# Patient Record
Sex: Female | Born: 1980
Health system: Southern US, Community
[De-identification: ages and names within clinical notes are randomized; demographics above are authoritative.]

## PROBLEM LIST (undated history)

## (undated) DIAGNOSIS — F419 Anxiety disorder, unspecified: Secondary | ICD-10-CM

## (undated) DIAGNOSIS — L719 Rosacea, unspecified: Secondary | ICD-10-CM

## (undated) DIAGNOSIS — T7840XA Allergy, unspecified, initial encounter: Secondary | ICD-10-CM

## (undated) DIAGNOSIS — R87619 Unspecified abnormal cytological findings in specimens from cervix uteri: Secondary | ICD-10-CM

## (undated) DIAGNOSIS — IMO0002 Reserved for concepts with insufficient information to code with codable children: Secondary | ICD-10-CM

## (undated) HISTORY — DX: Rosacea, unspecified: L71.9

## (undated) HISTORY — DX: Allergy, unspecified, initial encounter: T78.40XA

## (undated) HISTORY — PX: DILATION AND CURETTAGE OF UTERUS: SHX78

---

## 2006-06-21 ENCOUNTER — Emergency Department (HOSPITAL_COMMUNITY): Admission: EM | Admit: 2006-06-21 | Discharge: 2006-06-21 | Payer: Self-pay | Admitting: Emergency Medicine

## 2007-03-21 ENCOUNTER — Encounter: Admission: RE | Admit: 2007-03-21 | Discharge: 2007-03-21 | Payer: Self-pay | Admitting: Obstetrics and Gynecology
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
## 2007-12-29 ENCOUNTER — Encounter: Admission: RE | Admit: 2007-12-29 | Discharge: 2007-12-29 | Payer: Self-pay | Admitting: Orthopedic Surgery

## 2011-08-14 LAB — RUBELLA ANTIBODY, IGM: Rubella: IMMUNE

## 2011-08-14 LAB — ABO/RH

## 2011-08-14 LAB — RPR: RPR: NONREACTIVE

## 2011-08-14 LAB — HEPATITIS B SURFACE ANTIGEN: Hepatitis B Surface Ag: NEGATIVE

## 2011-12-04 NOTE — L&D Delivery Note (Signed)
Delivery Note At 4:29 AM a viable and healthy female was delivered via Vaginal, Spontaneous Delivery (Presentation: Left Occiput Anterior).  APGAR: 8, 9; weight 6 lb 13.9 oz (3115 g).   Placenta status: Intact, Spontaneous. Short marginal Cord: 3 vessels with the following complications: Short.  Cord pH: none  Anesthesia: Epidural  Episiotomy: None Lacerations: 2nd degree;Sulcus;Perineal;Periurethral;Vaginal Suture Repair: 3.0 chromic Est. Blood Loss (mL): 200  Mom to postpartum.  Baby to nursery-stable.  Covey Baller A 03/07/2012, 5:05 AM

## 2012-03-04 ENCOUNTER — Encounter (HOSPITAL_COMMUNITY): Payer: Self-pay | Admitting: *Deleted

## 2012-03-04 ENCOUNTER — Inpatient Hospital Stay (HOSPITAL_COMMUNITY)
Admission: AD | Admit: 2012-03-04 | Discharge: 2012-03-04 | Disposition: A | Discharge status: Home / Self Care | Payer: BC Managed Care – PPO | Source: Ambulatory Visit | Attending: Obstetrics and Gynecology | Admitting: Obstetrics and Gynecology

## 2012-03-04 DIAGNOSIS — O479 False labor, unspecified: Secondary | ICD-10-CM | POA: Insufficient documentation

## 2012-03-04 DIAGNOSIS — O4290 Premature rupture of membranes, unspecified as to length of time between rupture and onset of labor, unspecified weeks of gestation: Secondary | ICD-10-CM

## 2012-03-04 DIAGNOSIS — O368390 Maternal care for abnormalities of the fetal heart rate or rhythm, unspecified trimester, not applicable or unspecified: Secondary | ICD-10-CM | POA: Insufficient documentation

## 2012-03-04 DIAGNOSIS — O99891 Other specified diseases and conditions complicating pregnancy: Secondary | ICD-10-CM | POA: Insufficient documentation

## 2012-03-04 HISTORY — DX: Anxiety disorder, unspecified: F41.9

## 2012-03-04 HISTORY — DX: Unspecified abnormal cytological findings in specimens from cervix uteri: R87.619

## 2012-03-04 HISTORY — DX: Reserved for concepts with insufficient information to code with codable children: IMO0002

## 2012-03-04 NOTE — Discharge Instructions (Signed)
Oligohydramnios An unborn baby (fetus) lives in the mother's uterus in a sac of amniotic fluid. This fluid:  Protects the fetus from trauma.   Helps the fetus move freely inside the uterus.   Helps the fetal lungs, kidneys, and digestive system develop.   Protects the baby from infections.  Oligohydramnios is when there is not enough amniotic fluid in the amniotic sac. If this happens early in pregnancy, a fetus might not develop normally. If this happens in the second half of a pregnancy, a fetus might not grow as much as it should and could cause problems during delivery.  Oligohydramnios can also cause:  Pregnancy loss (miscarriage).   Premature birth.   Deformities of the face or body.   Problems with muscles and bones.   Pressure and compression on the umbilical cord, which decreases oxygen to the fetus.   Lung problems.   Stillbirth.  CAUSES A cause cannot always be found.However, possible causes include:  A leak or a tear in the amniotic sac.   A problem with the placenta.   Having identical twins who share the same placenta.   A fetal birth defect. This is usually something in the fetal kidneys or urinary tract that has not developed as it should.   A pregnancy that goes past the due date.   Something that affects the mother's body, such as:   Dehydration.   High blood pressure.   Diabetes.   Some medications (examples include ibuprofen and blood pressure medicines).   A disease that affects the skin, joints, kidneys and other organs (systemic lupus).   Birth defects.  SYMPTOMS  There may be no symptoms.   Leaking fluid from the vagina.   Measuring smaller than usual uterus at a routine pregnancy exam.  DIAGNOSIS To diagnose and evaluate oligohydramnios, your caregiver may:  Order a prenatal ultrasound test. This test:   Measures the amniotic fluid level. This will show if the amount of fluid is right for the stage of pregnancy.   Checks  the fetal kidneys.   Checks fetal growth.   Evaluates the placenta.   Confirm that you broke your water, if this is suspected by your caregiver.   Order a nonstress test. This noninvasive test is an assessment of fetal well-being.It monitors the fetal heart rate patterns over a 20 minute period.   Order a biophysical profile. This test measures and evaluates 5 observations of the baby (results of nonstress testing, fetal breathing, movements, muscle tone, and amount of amniotic fluid).   Assess fetal kick counts. Tell your caregiver if the baby appears to become less active.   Order a uterine artery doppler study. This is a type of ultrasound. It can show if enough blood and nourishment are getting to the fetus through the placenta and umbilical cord.   Check your blood pressure.   Check your blood sugar.  TREATMENT Treatment will depend on how low the fluid is, how far along in the pregnancy you are, and your overall health. Treatment options include:  Watching and waiting. You will need to be checked more often than normal.   Increasing your fluid intake. This may be done by mouth, or you might get the fluids through the vein (intravenously, IV).   Delivering the baby if recommended by your caregiver.  HOME CARE INSTRUCTIONS  Only take medicine as directed by your caregiver, especially if you have a medical problem (diabetes, high blood pressure).   Follow your caregiver's instructions regarding physical activity,  especially if you have a medical problem (diabetes, high blood pressure).   Keep all prenatal care appointments.   Rest as much as possible. Your caregiver may put you on bed rest.   Drink enough fluids to keep your urine clear or pale yellow.   Eat a healthy and nourishing diet.   Do not smoke, drink alcohol, or take illegal drugs.  SEEK MEDICAL CARE IF:  You have any questions or worries about your pregnancy.   You notice a decrease in fetal movement.    SEEK IMMEDIATE MEDICAL CARE IF:   Fluid comes out of your vagina.   You start to have labor pains (contractions).   You have a fever.  Document Released: 03/06/2011 Document Revised: 11/08/2011 Document Reviewed: 03/06/2011 [facility] Patient Information 2012 [place], [place].Fetal Movement Counts Patient Name: __________________________________________________ Patient Due Date: ____________________ Melody Haver counts is highly recommended in high risk pregnancies, but it is a good idea for every pregnant woman to do. Start counting fetal movements at 28 weeks of the pregnancy. Fetal movements increase after eating a full meal or eating or drinking something sweet (the blood sugar is higher). It is also important to drink plenty of fluids (well hydrated) before doing the count. Lie on your left side because it helps with the circulation or you can sit in a comfortable chair with your arms over your belly (abdomen) with no distractions around you. DOING THE COUNT  Try to do the count the same time of day each time you do it.   Mark the day and time, then see how long it takes for you to feel 10 movements (kicks, flutters, swishes, rolls). You should have at least 10 movements within 2 hours. You will most likely feel 10 movements in much less than 2 hours. If you do not, wait an hour and count again. After a couple of days you will see a pattern.   What you are looking for is a change in the pattern or not enough counts in 2 hours. Is it taking longer in time to reach 10 movements?  SEEK MEDICAL CARE IF:  You feel less than 10 counts in 2 hours. Tried twice.   No movement in one hour.   The pattern is changing or taking longer each day to reach 10 counts in 2 hours.   You feel the baby is not moving as it usually does.  Date: ____________ Movements: ____________ Start time: ____________ Doreatha Martin time: ____________  Date: ____________ Movements: ____________ Start time: ____________ Doreatha Martin time:  ____________ Date: ____________ Movements: ____________ Start time: ____________ Doreatha Martin time: ____________ Date: ____________ Movements: ____________ Start time: ____________ Doreatha Martin time: ____________ Date: ____________ Movements: ____________ Start time: ____________ Doreatha Martin time: ____________ Date: ____________ Movements: ____________ Start time: ____________ Doreatha Martin time: ____________ Date: ____________ Movements: ____________ Start time: ____________ Doreatha Martin time: ____________ Date: ____________ Movements: ____________ Start time: ____________ Doreatha Martin time: ____________  Date: ____________ Movements: ____________ Start time: ____________ Doreatha Martin time: ____________ Date: ____________ Movements: ____________ Start time: ____________ Doreatha Martin time: ____________ Date: ____________ Movements: ____________ Start time: ____________ Doreatha Martin time: ____________ Date: ____________ Movements: ____________ Start time: ____________ Doreatha Martin time: ____________ Date: ____________ Movements: ____________ Start time: ____________ Doreatha Martin time: ____________ Date: ____________ Movements: ____________ Start time: ____________ Doreatha Martin time: ____________ Date: ____________ Movements: ____________ Start time: ____________ Doreatha Martin time: ____________  Date: ____________ Movements: ____________ Start time: ____________ Doreatha Martin time: ____________ Date: ____________ Movements: ____________ Start time: ____________ Doreatha Martin time: ____________ Date: ____________ Movements: ____________ Start time: ____________ Doreatha Martin time: ____________ Date: ____________ Movements: ____________  Start time: ____________ Doreatha Martin time: ____________ Date: ____________ Movements: ____________ Start time: ____________ Doreatha Martin time: ____________ Date: ____________ Movements: ____________ Start time: ____________ Doreatha Martin time: ____________ Date: ____________ Movements: ____________ Start time: ____________ Doreatha Martin time: ____________  Date: ____________ Movements:  ____________ Start time: ____________ Doreatha Martin time: ____________ Date: ____________ Movements: ____________ Start time: ____________ Doreatha Martin time: ____________ Date: ____________ Movements: ____________ Start time: ____________ Doreatha Martin time: ____________ Date: ____________ Movements: ____________ Start time: ____________ Doreatha Martin time: ____________ Date: ____________ Movements: ____________ Start time: ____________ Doreatha Martin time: ____________ Date: ____________ Movements: ____________ Start time: ____________ Doreatha Martin time: ____________ Date: ____________ Movements: ____________ Start time: ____________ Doreatha Martin time: ____________  Date: ____________ Movements: ____________ Start time: ____________ Doreatha Martin time: ____________ Date: ____________ Movements: ____________ Start time: ____________ Doreatha Martin time: ____________ Date: ____________ Movements: ____________ Start time: ____________ Doreatha Martin time: ____________ Date: ____________ Movements: ____________ Start time: ____________ Doreatha Martin time: ____________ Date: ____________ Movements: ____________ Start time: ____________ Doreatha Martin time: ____________ Date: ____________ Movements: ____________ Start time: ____________ Doreatha Martin time: ____________ Date: ____________ Movements: ____________ Start time: ____________ Doreatha Martin time: ____________  Date: ____________ Movements: ____________ Start time: ____________ Doreatha Martin time: ____________ Date: ____________ Movements: ____________ Start time: ____________ Doreatha Martin time: ____________ Date: ____________ Movements: ____________ Start time: ____________ Doreatha Martin time: ____________ Date: ____________ Movements: ____________ Start time: ____________ Doreatha Martin time: ____________ Date: ____________ Movements: ____________ Start time: ____________ Doreatha Martin time: ____________ Date: ____________ Movements: ____________ Start time: ____________ Doreatha Martin time: ____________ Date: ____________ Movements: ____________ Start time: ____________ Doreatha Martin  time: ____________  Date: ____________ Movements: ____________ Start time: ____________ Doreatha Martin time: ____________ Date: ____________ Movements: ____________ Start time: ____________ Doreatha Martin time: ____________ Date: ____________ Movements: ____________ Start time: ____________ Doreatha Martin time: ____________ Date: ____________ Movements: ____________ Start time: ____________ Doreatha Martin time: ____________ Date: ____________ Movements: ____________ Start time: ____________ Doreatha Martin time: ____________ Date: ____________ Movements: ____________ Start time: ____________ Doreatha Martin time: ____________ Date: ____________ Movements: ____________ Start time: ____________ Doreatha Martin time: ____________  Date: ____________ Movements: ____________ Start time: ____________ Doreatha Martin time: ____________ Date: ____________ Movements: ____________ Start time: ____________ Doreatha Martin time: ____________ Date: ____________ Movements: ____________ Start time: ____________ Doreatha Martin time: ____________ Date: ____________ Movements: ____________ Start time: ____________ Doreatha Martin time: ____________ Date: ____________ Movements: ____________ Start time: ____________ Doreatha Martin time: ____________ Date: ____________ Movements: ____________ Start time: ____________ Doreatha Martin time: ____________ Document Released: 12/19/2006 Document Revised: 11/08/2011 Document Reviewed: 06/21/2009 [employer] Patient Information 2012 [employer].Braxton Hicks Contractions Pregnancy is commonly associated with contractions of the uterus throughout the pregnancy. Towards the end of pregnancy (32 to 34 weeks), these contractions [facility] Willa Rough) can develop more often and may become more forceful. This is not true labor because these contractions do not result in opening (dilatation) and thinning of the cervix. They are sometimes difficult to tell apart from true labor because these contractions can be forceful and people have different pain tolerances. You should not feel embarrassed if  you go to the hospital with false labor. Sometimes, the only way to tell if you are in true labor is for your caregiver to follow the changes in the cervix. How to tell the difference between true and false labor:  False labor.   The contractions of false labor are usually shorter, irregular and not as hard as those of true labor.   They are often felt in the front of the lower abdomen and in the groin.   They may leave with walking around or changing positions while lying down.   They get weaker and are shorter lasting as time goes on.   These contractions are usually irregular.   They  do not usually become progressively stronger, regular and closer together as with true labor.   True labor.   Contractions in true labor last 30 to 70 seconds, become very regular, usually become more intense, and increase in frequency.   They do not go away with walking.   The discomfort is usually felt in the top of the uterus and spreads to the lower abdomen and low back.   True labor can be determined by your caregiver with an exam. This will show that the cervix is dilating and getting thinner.  If there are no prenatal problems or other health problems associated with the pregnancy, it is completely safe to be sent home with false labor and await the onset of true labor. HOME CARE INSTRUCTIONS   Keep up with your usual exercises and instructions.   Take medications as directed.   Keep your regular prenatal appointment.   Eat and drink lightly if you think you are going into labor.   If BH contractions are making you uncomfortable:   Change your activity position from lying down or resting to walking/walking to resting.   Sit and rest in a tub of warm water.   Drink 2 to 3 glasses of water. Dehydration may cause B-H contractions.   Do slow and deep breathing several times an hour.  SEEK IMMEDIATE MEDICAL CARE IF:   Your contractions continue to become stronger, more regular, and  closer together.   You have a gushing, burst or leaking of fluid from the vagina.   An oral temperature above 102 F (38.9 C) develops.   You have passage of blood-tinged mucus.   You develop vaginal bleeding.   You develop continuous belly (abdominal) pain.   You have low back pain that you never had before.   You feel the baby's Mclennan pushing down causing pelvic pressure.   The baby is not moving as much as it used to.  Document Released: 11/19/2005 Document Revised: 11/08/2011 Document Reviewed: 05/13/2009 [facility] Patient Information 2012 [place], [place].

## 2012-03-04 NOTE — MAU Note (Signed)
Pt states she lost her mucous plug today around 2:30 or 3:00 pm and had a lot of wetness since for approx. 1 hour.  Wetness has decreased in last 1-2 hours.  No vaginal bleeding except a small amount in the mucous plug.

## 2012-03-04 NOTE — MAU Provider Note (Signed)
History    31 yo G2P0010 MWF now @ 37 2/7 wk presents for r/o SROm due to c/o wetness after loss mucus plug. (+) irreg ctx (+) FM( very active). sopno 2 wk ago upper nl AFI  Chief Complaint  Patient presents with  . Rupture of Membranes     OB History    Grav Para Term Preterm Abortions TAB SAB Ect Mult Living   2    1  1    0      Past Medical History  Diagnosis Date  . Abnormal Pap smear   . Anxiety     Past Surgical History  Procedure Date  . No past surgeries     Family History  Problem Relation Age of Onset  . Anesthesia problems Neg Hx     History  Substance Use Topics  . Smoking status: Never Smoker   . Smokeless tobacco: Never Used  . Alcohol Use: No    Allergies: No Known Allergies  Prescriptions prior to admission  Medication Sig Dispense Refill  . bifidobacterium infantis (ALIGN) capsule Take 1 capsule by mouth daily.      . hydrocortisone cream 1 % Apply 1 application topically daily as needed. For itching      . Prenatal Vit-Fe Fumarate-FA (PRENATAL MULTIVITAMIN) TABS Take 1 tablet by mouth daily.         Physical Exam   Blood pressure 125/79, pulse 78, temperature 99.4 F (37.4 C), temperature source Oral, resp. rate 20, height 5\' 4"  (1.626 m), weight 155 lb (70.308 kg), last menstrual period 06/17/2011, SpO2 98.00%.  General appearance: alert, cooperative and no distress Abdomen: gravid soft nontender Pelvic: cervix normal in appearance, external genitalia normal and closed/60%/-3 vtx white d/c Extremities: edema trace ED Course  Fern neg Tracing: baseline 130 (+) accels some variables ctx q 2-52mins MDM No evidence of ROM or labor IUP @ 37 2/7 wk P) d/c home labor prec. OB appt 1 wk   [name] A, MD 6:37 PM 03/04/2012

## 2012-03-06 ENCOUNTER — Encounter (HOSPITAL_COMMUNITY): Payer: Self-pay | Admitting: Anesthesiology

## 2012-03-06 ENCOUNTER — Inpatient Hospital Stay (HOSPITAL_COMMUNITY)
Admission: AD | Admit: 2012-03-06 | Discharge: 2012-03-08 | DRG: 373 | Disposition: A | Discharge status: Home / Self Care | Payer: BC Managed Care – PPO | Source: Ambulatory Visit | Attending: Obstetrics and Gynecology | Admitting: Obstetrics and Gynecology

## 2012-03-06 ENCOUNTER — Inpatient Hospital Stay (HOSPITAL_COMMUNITY): Payer: BC Managed Care – PPO | Admitting: Anesthesiology

## 2012-03-06 LAB — CBC
MCHC: 34 g/dL (ref 30.0–36.0)
Platelets: 246 10*3/uL (ref 150–400)
RDW: 14.1 % (ref 11.5–15.5)
WBC: 12.9 10*3/uL — ABNORMAL HIGH (ref 4.0–10.5)

## 2012-03-06 LAB — RPR: RPR Ser Ql: NONREACTIVE

## 2012-03-06 MED ORDER — LACTATED RINGERS IV SOLN
INTRAVENOUS | Status: DC
  Administered 2012-03-06: 21:00:00 via INTRAUTERINE

## 2012-03-06 MED ORDER — OXYCODONE-ACETAMINOPHEN 5-325 MG PO TABS: 1.0000 | ORAL_TABLET | ORAL | Status: DC | PRN

## 2012-03-06 MED ORDER — LACTATED RINGERS IV SOLN
INTRAVENOUS | Status: DC
  Administered 2012-03-06 (×2): via INTRAVENOUS

## 2012-03-06 MED ORDER — LACTATED RINGERS IV SOLN
500.0000 mL | Freq: Once | INTRAVENOUS | Status: AC
  Administered 2012-03-06: 20:00:00 via INTRAVENOUS

## 2012-03-06 MED ORDER — TERBUTALINE SULFATE 1 MG/ML IJ SOLN
0.2500 mg | Freq: Once | INTRAMUSCULAR | Status: AC | PRN
  Administered 2012-03-06: 0.25 mg via SUBCUTANEOUS
  Filled 2012-03-06: qty 1

## 2012-03-06 MED ORDER — NALBUPHINE SYRINGE 5 MG/0.5 ML: 10.0000 mg | INJECTION | INTRAMUSCULAR | Status: DC | PRN

## 2012-03-06 MED ORDER — FENTANYL 2.5 MCG/ML BUPIVACAINE 1/10 % EPIDURAL INFUSION (WH - ANES)
14.0000 mL/h | INTRAMUSCULAR | Status: DC
  Administered 2012-03-06: 14 mL/h via EPIDURAL
  Filled 2012-03-06 (×3): qty 60

## 2012-03-06 MED ORDER — IBUPROFEN 600 MG PO TABS: 600.0000 mg | ORAL_TABLET | Freq: Four times a day (QID) | ORAL | Status: DC | PRN

## 2012-03-06 MED ORDER — OXYTOCIN 20 UNITS IN LACTATED RINGERS INFUSION - SIMPLE
1.0000 m[IU]/min | INTRAVENOUS | Status: DC
  Administered 2012-03-06: 2 m[IU]/min via INTRAVENOUS
  Filled 2012-03-06: qty 1000

## 2012-03-06 MED ORDER — PHENYLEPHRINE 40 MCG/ML (10ML) SYRINGE FOR IV PUSH (FOR BLOOD PRESSURE SUPPORT)
80.0000 ug | PREFILLED_SYRINGE | INTRAVENOUS | Status: DC | PRN
  Filled 2012-03-06: qty 5

## 2012-03-06 MED ORDER — LIDOCAINE HCL (PF) 1 % IJ SOLN
INTRAMUSCULAR | Status: DC | PRN
  Administered 2012-03-06 (×2): 8 mL
  Administered 2012-03-07: 30 mL

## 2012-03-06 MED ORDER — PHENYLEPHRINE 40 MCG/ML (10ML) SYRINGE FOR IV PUSH (FOR BLOOD PRESSURE SUPPORT): 80.0000 ug | PREFILLED_SYRINGE | INTRAVENOUS | Status: DC | PRN

## 2012-03-06 MED ORDER — NALBUPHINE SYRINGE 5 MG/0.5 ML
10.0000 mg | INJECTION | INTRAMUSCULAR | Status: DC | PRN
  Filled 2012-03-06: qty 1

## 2012-03-06 MED ORDER — CITRIC ACID-SODIUM CITRATE 334-500 MG/5ML PO SOLN: 30.0000 mL | ORAL | Status: DC | PRN

## 2012-03-06 MED ORDER — EPHEDRINE 5 MG/ML INJ: 10.0000 mg | INTRAVENOUS | Status: DC | PRN

## 2012-03-06 MED ORDER — EPHEDRINE 5 MG/ML INJ
10.0000 mg | INTRAVENOUS | Status: DC | PRN
  Filled 2012-03-06: qty 4

## 2012-03-06 MED ORDER — OXYTOCIN BOLUS FROM INFUSION
500.0000 mL | Freq: Once | INTRAVENOUS | Status: DC
  Filled 2012-03-06: qty 500

## 2012-03-06 MED ORDER — OXYTOCIN 20 UNITS IN LACTATED RINGERS INFUSION - SIMPLE: 125.0000 mL/h | Freq: Once | INTRAVENOUS | Status: DC

## 2012-03-06 MED ORDER — LACTATED RINGERS IV SOLN: 500.0000 mL | INTRAVENOUS | Status: DC | PRN

## 2012-03-06 MED ORDER — ONDANSETRON HCL 4 MG/2ML IJ SOLN: 4.0000 mg | Freq: Four times a day (QID) | INTRAMUSCULAR | Status: DC | PRN

## 2012-03-06 MED ORDER — LIDOCAINE HCL (PF) 1 % IJ SOLN
30.0000 mL | INTRAMUSCULAR | Status: DC | PRN
  Filled 2012-03-06: qty 30

## 2012-03-06 MED ORDER — ACETAMINOPHEN 325 MG PO TABS: 650.0000 mg | ORAL_TABLET | ORAL | Status: DC | PRN

## 2012-03-06 MED ORDER — HYDROXYZINE HCL 50 MG/ML IM SOLN: 50.0000 mg | INTRAMUSCULAR | Status: DC | PRN

## 2012-03-06 MED ORDER — DIPHENHYDRAMINE HCL 50 MG/ML IJ SOLN: 12.5000 mg | INTRAMUSCULAR | Status: DC | PRN

## 2012-03-06 MED ORDER — FLEET ENEMA 7-19 GM/118ML RE ENEM: 1.0000 | ENEMA | RECTAL | Status: DC | PRN

## 2012-03-06 MED ORDER — FENTANYL 2.5 MCG/ML BUPIVACAINE 1/10 % EPIDURAL INFUSION (WH - ANES)
INTRAMUSCULAR | Status: DC | PRN
  Administered 2012-03-06: 14 mL/h via EPIDURAL

## 2012-03-06 NOTE — Progress Notes (Signed)
S; epidural.  Notes window of pain on left  O: VE deferred  Pitocin 4 miu( resumed at 10 PM  Tracing reviewed:  Baseline 130  mod variability no decel ctx q 2-3 mins IMP:  Reassuring tracing P) cont pitocin. Exaggerated sims position

## 2012-03-06 NOTE — Progress Notes (Signed)
S: Pt received epidural anesthesia. tracing reactive pre- procedure. Pt was on 10 MIU  Pitocin. Pt subsequently had ctx q 2 mins with FHR decel down to 80's x 6 mins. Pitocin d/c'ed  VE done: 4/80/-2  (+) scalp stimulation  [place]  Terbutaline given   ISE/IUPC placed  FHR returns to baseline 150 w/ decreased variability Rare ctx   IMP; Fetal heart decel 2nd to ? tetanic ctx vs epidural now return to baseline  P) amnioinfusion. Resume pitocin if indicated close fetal monitoring

## 2012-03-06 NOTE — Anesthesia Preprocedure Evaluation (Signed)
Anesthesia Evaluation  Patient identified by MRN, date of birth, ID band Patient awake    Reviewed: Allergy & Precautions, H&P , NPO status , Patient's Chart, lab work & pertinent test results  Airway Mallampati: I TM Distance: >3 FB Neck ROM: full    Dental No notable dental hx.    Pulmonary neg pulmonary ROS,  breath sounds clear to auscultation  Pulmonary exam normal       Cardiovascular negative cardio ROS      Neuro/Psych PSYCHIATRIC DISORDERS Anxiety negative neurological ROS  negative psych ROS   GI/Hepatic negative GI ROS, Neg liver ROS,   Endo/Other  negative endocrine ROS  Renal/GU negative Renal ROS  negative genitourinary   Musculoskeletal negative musculoskeletal ROS (+)   Abdominal Normal abdominal exam  (+)   Peds negative pediatric ROS (+)  Hematology negative hematology ROS (+)   Anesthesia Other Findings   Reproductive/Obstetrics (+) Pregnancy                           Anesthesia Physical Anesthesia Plan  ASA: II  Anesthesia Plan: Epidural   Post-op Pain Management:    Induction:   Airway Management Planned:   Additional Equipment:   Intra-op Plan:   Post-operative Plan:   Informed Consent: I have reviewed the patients History and Physical, chart, labs and discussed the procedure including the risks, benefits and alternatives for the proposed anesthesia with the patient or authorized representative who has indicated his/her understanding and acceptance.     Plan Discussed with:   Anesthesia Plan Comments:         Anesthesia Quick Evaluation

## 2012-03-06 NOTE — Anesthesia Procedure Notes (Signed)
Epidural Patient location during procedure: OB Start time: 03/06/2012 8:17 PM End time: 03/06/2012 8:21 PM Reason for block: procedure for pain  Staffing Anesthesiologist: Sandrea Hughs Performed by: anesthesiologist   Preanesthetic Checklist Completed: patient identified, site marked, surgical consent, pre-op evaluation, timeout performed, IV checked, risks and benefits discussed and monitors and equipment checked  Epidural Patient position: sitting Prep: site prepped and draped and DuraPrep Patient monitoring: continuous pulse ox and blood pressure Approach: midline Injection technique: LOR air  Needle:  Needle type: Tuohy  Needle gauge: 17 G Needle length: 9 cm Needle insertion depth: 5 cm cm Catheter type: closed end flexible Catheter size: 19 Gauge Catheter at skin depth: 10 cm Test dose: negative and Other  Assessment Sensory level: T10 Events: blood not aspirated, injection not painful, no injection resistance, negative IV test and no paresthesia

## 2012-03-06 NOTE — H&P (Signed)
Jaime Hampton is a 31 y.o. female presenting for SROM clear fluid @ 10: 30 am. (+) FM (+) irreg ctx Maternal Medical History:  Reason for admission: Reason for admission: rupture of membranes.  Contractions: Frequency: irregular.    Fetal activity: Perceived fetal activity is normal.    Prenatal Complications - Diabetes: none.    OB History    Grav Para Term Preterm Abortions TAB SAB Ect Mult Living   2    1  1    0     Past Medical History  Diagnosis Date  . Abnormal Pap smear   . Anxiety    Past Surgical History  Procedure Date  . No past surgeries    Family History: family history is negative for Anesthesia problems. Social History:  reports that she has never smoked. She has never used smokeless tobacco. She reports that she does not drink alcohol or use illicit drugs.  ROS neg  Dilation: 3.5 Effacement (%): 90 Station: -1 Exam by:: Penley, RN  Blood pressure 119/63, pulse 67, temperature 98.3 F (36.8 C), temperature source Oral, resp. rate 20, height 5\' 4"  (1.626 m), weight 151 lb (68.493 kg), last menstrual period 06/17/2011. Exam Physical Exam  Constitutional: She is oriented to person, place, and time. She appears well-developed and well-nourished.  Eyes: EOM are normal.  Neck: Neck supple.  Cardiovascular: Regular rhythm.   Respiratory: Breath sounds normal.  GI: Soft.  Musculoskeletal: She exhibits no edema.  Neurological: She is alert and oriented to person, place, and time.  Skin: Skin is warm and dry.  Psychiatric: She has a normal mood and affect.   tracing reactive irreg ctx Prenatal labs: ABO, Rh: A/Positive/-- (09/11 0000) Antibody: Negative (09/11 0000) Rubella: Immune (09/11 0000) RPR: Nonreactive (09/11 0000)  HBsAg: Negative (09/11 0000)  HIV: Non-reactive (09/11 0000)  GBS: Negative (03/14 0000)   Assessment/Plan SROM IUP @ 37 4/7  P) admit. Routine labs. Pain meds. pitocin   Natalie Leclaire A 03/06/2012, 7:58 PM

## 2012-03-07 ENCOUNTER — Encounter (HOSPITAL_COMMUNITY): Payer: Self-pay | Admitting: Obstetrics

## 2012-03-07 MED ORDER — PRENATAL MULTIVITAMIN CH
1.0000 | ORAL_TABLET | Freq: Every day | ORAL | Status: DC
  Administered 2012-03-07 – 2012-03-08 (×2): 1 via ORAL
  Filled 2012-03-07 (×2): qty 1

## 2012-03-07 MED ORDER — OXYCODONE-ACETAMINOPHEN 5-325 MG PO TABS
1.0000 | ORAL_TABLET | ORAL | Status: DC | PRN
  Administered 2012-03-07: 1 via ORAL
  Administered 2012-03-07 (×2): 2 via ORAL
  Filled 2012-03-07: qty 1
  Filled 2012-03-07 (×2): qty 2

## 2012-03-07 MED ORDER — BENZOCAINE-MENTHOL 20-0.5 % EX AERO: 1.0000 "application " | INHALATION_SPRAY | CUTANEOUS | Status: DC | PRN

## 2012-03-07 MED ORDER — MEASLES, MUMPS & RUBELLA VAC ~~LOC~~ INJ
0.5000 mL | INJECTION | Freq: Once | SUBCUTANEOUS | Status: DC
  Filled 2012-03-07: qty 0.5

## 2012-03-07 MED ORDER — DIPHENHYDRAMINE HCL 25 MG PO CAPS: 25.0000 mg | ORAL_CAPSULE | Freq: Four times a day (QID) | ORAL | Status: DC | PRN

## 2012-03-07 MED ORDER — IBUPROFEN 600 MG PO TABS
600.0000 mg | ORAL_TABLET | Freq: Four times a day (QID) | ORAL | Status: DC
  Administered 2012-03-07 – 2012-03-08 (×6): 600 mg via ORAL
  Filled 2012-03-07 (×5): qty 1

## 2012-03-07 MED ORDER — ONDANSETRON HCL 4 MG PO TABS: 4.0000 mg | ORAL_TABLET | ORAL | Status: DC | PRN

## 2012-03-07 MED ORDER — ONDANSETRON HCL 4 MG/2ML IJ SOLN: 4.0000 mg | INTRAMUSCULAR | Status: DC | PRN

## 2012-03-07 MED ORDER — DIBUCAINE 1 % RE OINT: 1.0000 "application " | TOPICAL_OINTMENT | RECTAL | Status: DC | PRN

## 2012-03-07 MED ORDER — BENZOCAINE-MENTHOL 20-0.5 % EX AERO
INHALATION_SPRAY | CUTANEOUS | Status: AC
  Filled 2012-03-07: qty 56

## 2012-03-07 MED ORDER — SENNOSIDES-DOCUSATE SODIUM 8.6-50 MG PO TABS
2.0000 | ORAL_TABLET | Freq: Every day | ORAL | Status: DC
  Administered 2012-03-07: 2 via ORAL

## 2012-03-07 MED ORDER — ZOLPIDEM TARTRATE 5 MG PO TABS: 5.0000 mg | ORAL_TABLET | Freq: Every evening | ORAL | Status: DC | PRN

## 2012-03-07 MED ORDER — TETANUS-DIPHTH-ACELL PERTUSSIS 5-2.5-18.5 LF-MCG/0.5 IM SUSP
0.5000 mL | Freq: Once | INTRAMUSCULAR | Status: AC
  Administered 2012-03-08: 0.5 mL via INTRAMUSCULAR
  Filled 2012-03-07: qty 0.5

## 2012-03-07 MED ORDER — LANOLIN HYDROUS EX OINT: TOPICAL_OINTMENT | CUTANEOUS | Status: DC | PRN

## 2012-03-07 MED ORDER — WITCH HAZEL-GLYCERIN EX PADS: 1.0000 "application " | MEDICATED_PAD | CUTANEOUS | Status: DC | PRN

## 2012-03-07 MED ORDER — SIMETHICONE 80 MG PO CHEW: 80.0000 mg | CHEWABLE_TABLET | ORAL | Status: DC | PRN

## 2012-03-07 NOTE — Progress Notes (Signed)
UR Chart review completed.  

## 2012-03-07 NOTE — Anesthesia Postprocedure Evaluation (Addendum)
  Anesthesia Post-op Note  Patient: Jaime Hampton  Procedure(s) Performed: * No procedures listed *  Patient Location: 311  Anesthesia Type: Epidural  Level of Consciousness: awake, alert  and oriented  Airway and Oxygen Therapy: Patient Spontanous Breathing  Post-op Pain: mild  Post-op Assessment: Post epidural cardiovascular vital signs stable. Still experiencing some residual numbness and weakness in legs R > L.  Patient reports that right leg was much more numb than left during labor. The symptoms are gradually improving as time progresses.  Post-op Vital Signs: Reviewed and stable  Complications: No apparent anesthesia complications

## 2012-03-07 NOTE — Progress Notes (Signed)
S: comfortable. No urge to push  VE: fully (+) 2 station Amnioinfusion  Tracing > baseline 140 reactive. Some variable decels ctx q 3 mins  IMP: complete P) start pushing

## 2012-03-08 ENCOUNTER — Encounter (HOSPITAL_COMMUNITY): Payer: Self-pay | Admitting: *Deleted

## 2012-03-08 LAB — CBC
HCT: 25.8 % — ABNORMAL LOW (ref 36.0–46.0)
Hemoglobin: 8.7 g/dL — ABNORMAL LOW (ref 12.0–15.0)
MCV: 96.3 fL (ref 78.0–100.0)
RBC: 2.68 MIL/uL — ABNORMAL LOW (ref 3.87–5.11)
WBC: 14 10*3/uL — ABNORMAL HIGH (ref 4.0–10.5)

## 2012-03-08 MED ORDER — INTEGRA PLUS PO CAPS: 1.0000 | ORAL_CAPSULE | Freq: Every day | ORAL | Status: DC

## 2012-03-08 MED ORDER — IBUPROFEN 800 MG PO TABS: ORAL_TABLET | ORAL | Status: DC

## 2012-03-08 MED ORDER — OXYCODONE-ACETAMINOPHEN 5-325 MG PO TABS: 1.0000 | ORAL_TABLET | ORAL | Status: AC | PRN

## 2012-03-08 NOTE — Progress Notes (Signed)
D/C instructions reviewed with pt.  Pt. States understanding of self care and baby care.  No home equipment needed.  Ambulated to car with staff without incident with staff.  D/C'd home with family.

## 2012-03-08 NOTE — Discharge Summary (Signed)
Obstetric Discharge Summary Reason for Admission: rupture of membranes Prenatal Procedures: ultrasound Intrapartum Procedures: spontaneous vaginal delivery Postpartum Procedures: none Complications-Operative and Postpartum: 2nd degree perineal laceration and vaginal laceration Hemoglobin  Date Value Range Status  03/08/2012 8.7* 12.0-15.0 (g/dL) Final     DELTA CHECK NOTED     REPEATED TO VERIFY     HCT  Date Value Range Status  03/08/2012 25.8* 36.0-46.0 (%) Final    Physical Exam:  General: alert, cooperative and no distress Lochia: appropriate Uterine Fundus: firm 1 FB  Above umb Incision: none DVT Evaluation: No evidence of DVT seen on physical exam.  Discharge Diagnoses: Term Pregnancy-delivered  Discharge Information: Date: 03/08/2012 Activity: pelvic rest Diet: routine Medications: PNV, Ibuprofen, Iron and Percocet Condition: stable Instructions: refer to practice specific booklet Discharge to: home Follow-up Information    Follow up with Addilynn Mowrer A, MD in 6 weeks.   Contact information:   [address] [place] [address] [phone]          Newborn Data: Live born female  Birth Weight: 6 lb 13.9 oz (3115 g) APGAR: 8, 9  Home with mother.  Corayma Cashatt A 03/08/2012, 10:34 AM

## 2012-03-08 NOTE — Progress Notes (Signed)
PPD1 SVD:   S:  Pt reports feeling well. Bottom sore / Tolerating po/ Voiding without problems/ No n/v/ Bleeding is moderate/ Pain controlled withibuprofen (OTC) and narcotic analgesics including percocet  Newborn info  Live female circ done this am. BRF   O:  A & O x 3 stable/ VS: Blood pressure 103/59, pulse 79, temperature 98.2 F (36.8 C), temperature source Oral, resp. rate 18, height 5\' 4"  (1.626 m), weight 151 lb (68.493 kg), last menstrual period 06/17/2011, SpO2 98.00%, unknown if currently breastfeeding.  LABS:  Results for orders placed during the hospital encounter of 03/06/12 (from the past 24 hour(s))  CBC     Status: Abnormal   Collection Time   03/08/12  5:37 AM      Component Value Range   WBC 14.0 (*) 4.0 - 10.5 (K/uL)   RBC 2.68 (*) 3.87 - 5.11 (MIL/uL)   Hemoglobin 8.7 (*) 12.0 - 15.0 (g/dL)   HCT 45.4 (*) 09.8 - 46.0 (%)   MCV 96.3  78.0 - 100.0 (fL)   MCH 32.8  26.0 - 34.0 (pg)   MCHC 34.1  30.0 - 36.0 (g/dL)   RDW 11.9  14.7 - 82.9 (%)   Platelets 214  150 - 400 (K/uL)    I&O: I/O last 3 completed shifts: In: -  Out: 200 [Blood:200]      Lungs: chest clear, no wheezing, rales, normal symmetric air entry, Heart exam - S1, S2 normal, no murmur, no gallop, rate regular  Heart: regular rate and rhythm  Abdomen: benign non-tender, without masses or organomegaly palpable ut firm 1 fb above umb firm  Perineum: healing with good reapproximation  Lochia: mod  Extremities:no redness or tenderness in the calves or thighs, no edema, edema tr+    A/P: PPD # 1/ [id number] 2) Anemia  Doing well  Continue routine post partum orders  Motrin 800 mg po q6-8 hr prn              Tylox 1-2 tabs po q 3-4 hrs prn integra plus one po qd  pp booklet given

## 2012-03-08 NOTE — Discharge Instructions (Addendum)
Call if temperature greater than equal to 100.4, nothing per vagina for 4-6 weeks or severe nausea vomiting, increased incisional pain , drainage or redness in the incision site, no straining with bowel movements, showers no bath Home Care Instructions for Mom After discharge, you may discover that you still have questions about body changes, activity, and care during the next few weeks. The following information should be helpful in answering many of your questions. ACTIVITY  Gradually resume your daily activities at home.   Allow time for rest periods during the day and nap while your newborn sleeps.   Avoid heavy lifting (more than 10 lb [4.5 kg]) and strenuous work or sports. Slow to moderate walking is usually safe.   If you had a cesarean delivery, do not vacuum, climb stairs, or drive a car for 4 to 6 weeks.   If you had a cesarean delivery, make arrangements for help at home until you feel you are okay to do your usual activities yourself.   Ask your caregiver for safe exercises to do after delivery, especially if you had a cesarean delivery.  VAGINAL FLOW AND RETURN OF YOUR MENSTRUAL PERIOD  Vaginal flow may continue for 4 to 6 weeks after delivery.   Usually the amount decreases and the color of blood gets lighter.   Bright red blood and an increased flow may reoccur if you have been too active.   Lie down, raise (elevate) your feet, place a cold compress on your lower abdomen, rest, and call your caregiver if you are soaking more than 1 pad an hour or passing large clots.   Menstrual periods will usually return 6 to 8 weeks after delivery.   If you are breastfeeding, your period will return anywhere from 8 weeks to the time you stop breastfeeding.  PERINEAL CARE  Use the peri bottle and change pads each time you go to the bathroom.   Use towelettes in place of toilet paper until stitches are healed.   Take warm tub baths for 15 to 20 minutes.   Continue to use medicated  pads or pain relieving spray.   Lidocaine cream for episiotomy pain can be used with your caregiver's approval.   Do not use tampons or douches until vaginal bleeding has stopped (about 4 weeks).   Sexual intercourse should be avoided for at least 3 to 4 weeks after delivery or until the brownish-red vaginal flow is completely gone.   Wipe from front to back.  INCISION CARE (AFTER A CESAREAN DELIVERY)  Shower as desired. Try to keep your incision dry.  BOWELS AND HEMORRHOIDS  Drink at least 6 to 8 glasses of non-caffeinated fluids per day.   Eat fiber-rich diet with whole grains, raw fruits, and vegetables.   Take frequent warm tub baths if hemorrhoids are a problem.   Avoid straining when having a bowel movement.   Over-the-counter medicines and stool softeners can be used as directed by your caregiver.  NUTRITION  Eat a well-balanced diet that includes the basic food groups.   Do not try to lose weight quickly by cutting back on calories.   If you are breastfeeding, drink at least 8 to 10 glasses of non-caffeinated fluid per day and increase your intake by 600 calories a day.   Take your prenatal vitamins until your postpartum checkup or until your caregiver tells you to stop.  BREASTFEEDING If you are not breastfeeding:  Wear a good tight-fitting bra.   Limit fluid intake after 1 or  2 days after delivery, or as directed, if your breasts are becoming engorged.   Avoid nipple stimulation and apply cool (not icy cold) compresses to the breasts for comfort as needed.   Avoid drinking alcohol and caffeinated drinks.   Mild over-the-counter pain medication recommended by your caregiver is helpful for breast discomfort.   Medications to dry up breast milk are not recommended.  If you are breastfeeding:  Encourage your newborn to breastfeed if you think he or she is hungry.   Wash your hands before breastfeeding.   Clean your breasts with warm water before nursing.    Start to encourage feeding 8 to 12 times a day for 10 to 15 minutes on each breast in the beginning to help stimulate milk production and train your newborn.   Avoid water and formula supplements for your newborn unless otherwise directed.   Have your newborn seen by his or her caregiver 3 to 5 days after delivery and again at 2 to 3 weeks to evaluate his or her progress with breastfeeding.   Call your newborn's caregiver if you think he or she is not gaining weight or may be losing weight.  POSTPARTUM BLUES Following delivery, your body goes through a drastic change in hormone levels. You may find yourself crying for no apparent reason and unable to cope with all the changes a newborn brings. Get support from your partner and friends. Give yourself time to adjust. If these feelings persist after several weeks, contact your caregiver or other professionals that can help you. Call your local emergency department, go to the emergency room, or get help right away from a relative, friend, or neighbor if you feel you are about to harm yourself, your newborn, or anyone else. EXERCISES Start Kegel exercises right after delivery. You can do it while standing, sitting, or lying down. Tighten your stomach muscles and the muscles surrounding your birth canal. Hold for a few seconds and then relax. Repeat 5 times each time. Make Kegel exercises a part of your daily routine to maintain the muscle tone that supports your vagina, bladder, and bowels. SELF BREAST EXAMINATION  Do breast self-exams at the same time of the month, each month.   Any lump, bump, or discharge should be reported to your caregiver.   Check your breasts, if you are breastfeeding, just after a feeding when your breasts are less full. If your period has started and you are breastfeeding, check your breasts on the fifth to seventh day of your period.   Breasts are normally lumpy if you are breastfeeding due to the fullness of the milk  cells. This is temporary and not a health risk.  INTIMACY AND SEXUALITY New parents need time to adjust to each other intimately and sexually after giving birth. Try to spend time as a couple discussing ways to adjust to your infant, your new schedule, and how to meet both your desires and needs. Counseling can be helpful in deeply troubled cases. If you are breastfeeding or not yet having a menstrual period, you can get pregnant. Use some form of birth control to prevent getting pregnant. Talk to your caregiver about the birth control choices that are available to you for you situation. SEEK IMMEDIATE MEDICAL CARE IF:   Drainage increases from the Cesarean incision, episiotomy or tear site, or the drainage starts to smell bad.   You soak pads with blood in 1 hour or less.   You have severe lower abdominal pain or cramping.  You have a bad smelling vaginal discharge.   You have increased rather than decreased pain around stitches or swelling, redness, or hardness in area.   You have an oral temperature above100.4 F (38.9 C),pain not controlled by medicine.   You have pain or redness in the calf of the leg.   You feel sick to your stomach (nausea) and throw up (vomit) for 12 hours.   You have sudden, severe chest pain.   You have shortness of breath.   You have painful or bloody urination.   You have visual problems.   You develop a severe headache.   An area of your breast is red and sore, and you have a fever. You may feel like you have flu symptoms.  Document Released: 11/16/2000 Document Revised: 11/08/2011 Document Reviewed: 04/17/2010

## 2012-03-17 ENCOUNTER — Inpatient Hospital Stay (HOSPITAL_COMMUNITY): Admission: RE | Admit: 2012-03-17 | Payer: BC Managed Care – PPO | Source: Ambulatory Visit

## 2012-03-20 ENCOUNTER — Inpatient Hospital Stay (HOSPITAL_COMMUNITY): Admission: RE | Admit: 2012-03-20 | Payer: BC Managed Care – PPO | Source: Ambulatory Visit

## 2013-07-13 DIAGNOSIS — G4700 Insomnia, unspecified: Secondary | ICD-10-CM | POA: Insufficient documentation

## 2014-10-04 ENCOUNTER — Encounter (HOSPITAL_COMMUNITY): Payer: Self-pay | Admitting: *Deleted

## 2016-04-26 DIAGNOSIS — M26629 Arthralgia of temporomandibular joint, unspecified side: Secondary | ICD-10-CM | POA: Insufficient documentation

## 2016-04-26 DIAGNOSIS — K219 Gastro-esophageal reflux disease without esophagitis: Secondary | ICD-10-CM | POA: Insufficient documentation

## 2016-12-03 NOTE — L&D Delivery Note (Signed)
Delivery Note At 8:12 AM a viable and healthy female was delivered via Vaginal, Spontaneous Delivery (Presentation:OA ; vtx ).  APGAR: 9, 9; weight pending  .   Placenta status:spontaneous, intact ,not sent .  Cord:  with the following complications: .none  Cord pH: none Anesthesia: epidural  Episiotomy: None Lacerations: 2nd degree; left Sulcus;Vaginal;Perineal Suture Repair: 3.0 chromic Est. Blood Loss (mL): 200  Mom to postpartum.  Baby to Couplet care / Skin to Skin.  Alwyn Cordner A 07/21/2017, 8:54 AM

## 2016-12-13 LAB — OB RESULTS CONSOLE ABO/RH: RH Type: POSITIVE

## 2016-12-13 LAB — OB RESULTS CONSOLE GC/CHLAMYDIA
CHLAMYDIA, DNA PROBE: NEGATIVE
GC PROBE AMP, GENITAL: NEGATIVE

## 2016-12-13 LAB — OB RESULTS CONSOLE ANTIBODY SCREEN: ANTIBODY SCREEN: NEGATIVE

## 2016-12-13 LAB — OB RESULTS CONSOLE RPR: RPR: NONREACTIVE

## 2016-12-13 LAB — OB RESULTS CONSOLE HEPATITIS B SURFACE ANTIGEN: Hepatitis B Surface Ag: NEGATIVE

## 2016-12-13 LAB — OB RESULTS CONSOLE RUBELLA ANTIBODY, IGM: Rubella: IMMUNE

## 2016-12-13 LAB — OB RESULTS CONSOLE HIV ANTIBODY (ROUTINE TESTING): HIV: NONREACTIVE

## 2017-03-12 ENCOUNTER — Ambulatory Visit (HOSPITAL_COMMUNITY)
Admission: RE | Admit: 2017-03-12 | Discharge: 2017-03-12 | Disposition: A | Discharge status: Home / Self Care | Payer: BC Managed Care – PPO | Source: Ambulatory Visit | Attending: Obstetrics and Gynecology | Admitting: Obstetrics and Gynecology

## 2017-03-12 ENCOUNTER — Other Ambulatory Visit (HOSPITAL_COMMUNITY): Payer: Self-pay | Admitting: Obstetrics and Gynecology

## 2017-03-12 ENCOUNTER — Encounter (HOSPITAL_COMMUNITY): Payer: Self-pay | Admitting: Radiology

## 2017-03-12 DIAGNOSIS — R1031 Right lower quadrant pain: Secondary | ICD-10-CM

## 2017-03-12 DIAGNOSIS — N1330 Unspecified hydronephrosis: Secondary | ICD-10-CM | POA: Insufficient documentation

## 2017-03-12 DIAGNOSIS — O26892 Other specified pregnancy related conditions, second trimester: Secondary | ICD-10-CM | POA: Diagnosis not present

## 2017-03-12 DIAGNOSIS — R110 Nausea: Secondary | ICD-10-CM

## 2017-03-12 DIAGNOSIS — Z3A21 21 weeks gestation of pregnancy: Secondary | ICD-10-CM | POA: Diagnosis not present

## 2017-06-14 LAB — OB RESULTS CONSOLE GBS: STREP GROUP B AG: NEGATIVE

## 2017-07-15 ENCOUNTER — Encounter (HOSPITAL_COMMUNITY): Payer: Self-pay | Admitting: *Deleted

## 2017-07-15 ENCOUNTER — Telehealth (HOSPITAL_COMMUNITY): Payer: Self-pay | Admitting: *Deleted

## 2017-07-15 NOTE — Telephone Encounter (Signed)
Preadmission screen  

## 2017-07-19 ENCOUNTER — Other Ambulatory Visit: Payer: Self-pay | Admitting: Obstetrics and Gynecology

## 2017-07-20 ENCOUNTER — Inpatient Hospital Stay (HOSPITAL_COMMUNITY): Payer: BC Managed Care – PPO

## 2017-07-20 ENCOUNTER — Inpatient Hospital Stay (HOSPITAL_COMMUNITY)
Admission: AD | Admit: 2017-07-20 | Discharge: 2017-07-20 | Disposition: A | Discharge status: Home / Self Care | Payer: BC Managed Care – PPO | Source: Ambulatory Visit | Attending: Obstetrics and Gynecology | Admitting: Obstetrics and Gynecology

## 2017-07-20 DIAGNOSIS — O471 False labor at or after 37 completed weeks of gestation: Secondary | ICD-10-CM

## 2017-07-20 NOTE — MAU Note (Signed)
Reports ctx on and off for a couple weeks. Today they have been regular 3-5 min apart for several hours starting around noon. Some bloody show. Last SVE was 1.5/70/-2

## 2017-07-20 NOTE — Discharge Instructions (Signed)

## 2017-07-20 NOTE — OB Triage Note (Signed)
I have communicated with Dr. cousins and reviewed vital signs:  Vitals:   07/20/17 1742 07/20/17 1917  BP: 117/86 126/80  Pulse: 92 81  Resp: 18 18  Temp: 97.7 F (36.5 C) 98 F (36.7 C)    Vaginal exam:  Dilation: 3.5 Effacement (%): 70 Cervical Position: Posterior Station: -2 Presentation: Vertex Exam by:: Karl Ito, rnc ,   Also reviewed contraction pattern and that non-stress test is reactive.  It has been documented that patient is contracting irregularly with minimal cervical change over 1.5 hours not indicating active labor.  Patient denies any other complaints.  Based on this report provider has given order for discharge.  A discharge order and diagnosis entered by a provider.   Labor discharge instructions reviewed with patient.

## 2017-07-21 ENCOUNTER — Encounter (HOSPITAL_COMMUNITY): Payer: Self-pay

## 2017-07-21 ENCOUNTER — Inpatient Hospital Stay (HOSPITAL_COMMUNITY): Payer: BC Managed Care – PPO | Admitting: Anesthesiology

## 2017-07-21 ENCOUNTER — Inpatient Hospital Stay (HOSPITAL_COMMUNITY)
Admission: AD | Admit: 2017-07-21 | Discharge: 2017-07-22 | DRG: 775 | Disposition: A | Discharge status: Home / Self Care | Payer: BC Managed Care – PPO | Source: Ambulatory Visit | Attending: Obstetrics and Gynecology | Admitting: Obstetrics and Gynecology

## 2017-07-21 DIAGNOSIS — O480 Post-term pregnancy: Secondary | ICD-10-CM | POA: Diagnosis present

## 2017-07-21 DIAGNOSIS — Z3A40 40 weeks gestation of pregnancy: Secondary | ICD-10-CM

## 2017-07-21 DIAGNOSIS — Z87442 Personal history of urinary calculi: Secondary | ICD-10-CM | POA: Diagnosis not present

## 2017-07-21 LAB — CBC
HCT: 35.2 % — ABNORMAL LOW (ref 36.0–46.0)
Hemoglobin: 12.1 g/dL (ref 12.0–15.0)
MCH: 31.3 pg (ref 26.0–34.0)
MCHC: 34.4 g/dL (ref 30.0–36.0)
MCV: 91 fL (ref 78.0–100.0)
PLATELETS: 242 10*3/uL (ref 150–400)
RBC: 3.87 MIL/uL (ref 3.87–5.11)
RDW: 14.3 % (ref 11.5–15.5)
WBC: 12.6 10*3/uL — AB (ref 4.0–10.5)

## 2017-07-21 LAB — ABO/RH: ABO/RH(D): A POS

## 2017-07-21 LAB — TYPE AND SCREEN
ABO/RH(D): A POS
Antibody Screen: NEGATIVE

## 2017-07-21 LAB — RPR: RPR Ser Ql: NONREACTIVE

## 2017-07-21 MED ORDER — OXYTOCIN 40 UNITS IN LACTATED RINGERS INFUSION - SIMPLE MED: 2.5000 [IU]/h | INTRAVENOUS | Status: DC

## 2017-07-21 MED ORDER — OXYTOCIN 40 UNITS IN LACTATED RINGERS INFUSION - SIMPLE MED
1.0000 m[IU]/min | INTRAVENOUS | Status: DC
  Filled 2017-07-21: qty 1000

## 2017-07-21 MED ORDER — SENNOSIDES-DOCUSATE SODIUM 8.6-50 MG PO TABS
2.0000 | ORAL_TABLET | ORAL | Status: DC
  Administered 2017-07-21: 2 via ORAL
  Filled 2017-07-21: qty 2

## 2017-07-21 MED ORDER — FENTANYL 2.5 MCG/ML BUPIVACAINE 1/10 % EPIDURAL INFUSION (WH - ANES)
INTRAMUSCULAR | Status: AC
  Filled 2017-07-21: qty 100

## 2017-07-21 MED ORDER — PHENYLEPHRINE 40 MCG/ML (10ML) SYRINGE FOR IV PUSH (FOR BLOOD PRESSURE SUPPORT): 80.0000 ug | PREFILLED_SYRINGE | INTRAVENOUS | Status: DC | PRN

## 2017-07-21 MED ORDER — SIMETHICONE 80 MG PO CHEW: 80.0000 mg | CHEWABLE_TABLET | ORAL | Status: DC | PRN

## 2017-07-21 MED ORDER — ACETAMINOPHEN 325 MG PO TABS: 650.0000 mg | ORAL_TABLET | ORAL | Status: DC | PRN

## 2017-07-21 MED ORDER — OXYCODONE HCL 5 MG PO TABS: 5.0000 mg | ORAL_TABLET | ORAL | Status: DC | PRN

## 2017-07-21 MED ORDER — LACTATED RINGERS IV SOLN: INTRAVENOUS | Status: DC

## 2017-07-21 MED ORDER — LIDOCAINE HCL (PF) 1 % IJ SOLN
INTRAMUSCULAR | Status: DC | PRN
  Administered 2017-07-21 (×2): 4 mL

## 2017-07-21 MED ORDER — PHENYLEPHRINE 40 MCG/ML (10ML) SYRINGE FOR IV PUSH (FOR BLOOD PRESSURE SUPPORT)
PREFILLED_SYRINGE | INTRAVENOUS | Status: AC
  Filled 2017-07-21: qty 20

## 2017-07-21 MED ORDER — EPHEDRINE 5 MG/ML INJ: 10.0000 mg | INTRAVENOUS | Status: DC | PRN

## 2017-07-21 MED ORDER — EPHEDRINE 5 MG/ML INJ
10.0000 mg | INTRAVENOUS | Status: DC | PRN
  Filled 2017-07-21: qty 2

## 2017-07-21 MED ORDER — DIPHENHYDRAMINE HCL 25 MG PO CAPS: 25.0000 mg | ORAL_CAPSULE | Freq: Four times a day (QID) | ORAL | Status: DC | PRN

## 2017-07-21 MED ORDER — ONDANSETRON HCL 4 MG PO TABS: 4.0000 mg | ORAL_TABLET | ORAL | Status: DC | PRN

## 2017-07-21 MED ORDER — PHENYLEPHRINE 40 MCG/ML (10ML) SYRINGE FOR IV PUSH (FOR BLOOD PRESSURE SUPPORT)
80.0000 ug | PREFILLED_SYRINGE | INTRAVENOUS | Status: DC | PRN
  Filled 2017-07-21: qty 5

## 2017-07-21 MED ORDER — ONDANSETRON HCL 4 MG/2ML IJ SOLN: 4.0000 mg | INTRAMUSCULAR | Status: DC | PRN

## 2017-07-21 MED ORDER — COCONUT OIL OIL: 1.0000 "application " | TOPICAL_OIL | Status: DC | PRN

## 2017-07-21 MED ORDER — DIPHENHYDRAMINE HCL 50 MG/ML IJ SOLN: 12.5000 mg | INTRAMUSCULAR | Status: DC | PRN

## 2017-07-21 MED ORDER — TERBUTALINE SULFATE 1 MG/ML IJ SOLN
0.2500 mg | Freq: Once | INTRAMUSCULAR | Status: DC | PRN
Start: 2017-07-21 — End: 2017-07-21
  Filled 2017-07-21: qty 1

## 2017-07-21 MED ORDER — OXYTOCIN BOLUS FROM INFUSION
500.0000 mL | Freq: Once | INTRAVENOUS | Status: AC
  Administered 2017-07-21: 500 mL via INTRAVENOUS

## 2017-07-21 MED ORDER — PRENATAL MULTIVITAMIN CH
1.0000 | ORAL_TABLET | Freq: Every day | ORAL | Status: DC
  Administered 2017-07-22: 1 via ORAL
  Filled 2017-07-21: qty 1

## 2017-07-21 MED ORDER — BENZOCAINE-MENTHOL 20-0.5 % EX AERO
1.0000 "application " | INHALATION_SPRAY | CUTANEOUS | Status: DC | PRN
  Administered 2017-07-21: 1 via TOPICAL
  Filled 2017-07-21: qty 56

## 2017-07-21 MED ORDER — ACETAMINOPHEN 325 MG PO TABS
650.0000 mg | ORAL_TABLET | ORAL | Status: DC | PRN
  Administered 2017-07-21 (×2): 650 mg via ORAL
  Filled 2017-07-21 (×2): qty 2

## 2017-07-21 MED ORDER — ONDANSETRON HCL 4 MG/2ML IJ SOLN: 4.0000 mg | Freq: Four times a day (QID) | INTRAMUSCULAR | Status: DC | PRN

## 2017-07-21 MED ORDER — FERROUS SULFATE 325 (65 FE) MG PO TABS
325.0000 mg | ORAL_TABLET | Freq: Two times a day (BID) | ORAL | Status: DC
  Administered 2017-07-21 – 2017-07-22 (×2): 325 mg via ORAL
  Filled 2017-07-21 (×2): qty 1

## 2017-07-21 MED ORDER — WITCH HAZEL-GLYCERIN EX PADS: 1.0000 "application " | MEDICATED_PAD | CUTANEOUS | Status: DC | PRN

## 2017-07-21 MED ORDER — OXYCODONE-ACETAMINOPHEN 5-325 MG PO TABS
1.0000 | ORAL_TABLET | ORAL | Status: DC | PRN
Start: 2017-07-21 — End: 2017-07-21

## 2017-07-21 MED ORDER — ZOLPIDEM TARTRATE 5 MG PO TABS: 5.0000 mg | ORAL_TABLET | Freq: Every evening | ORAL | Status: DC | PRN

## 2017-07-21 MED ORDER — LACTATED RINGERS IV SOLN: 500.0000 mL | INTRAVENOUS | Status: DC | PRN

## 2017-07-21 MED ORDER — SOD CITRATE-CITRIC ACID 500-334 MG/5ML PO SOLN: 30.0000 mL | ORAL | Status: DC | PRN

## 2017-07-21 MED ORDER — DIBUCAINE 1 % RE OINT: 1.0000 "application " | TOPICAL_OINTMENT | RECTAL | Status: DC | PRN

## 2017-07-21 MED ORDER — FENTANYL 2.5 MCG/ML BUPIVACAINE 1/10 % EPIDURAL INFUSION (WH - ANES)
14.0000 mL/h | INTRAMUSCULAR | Status: DC | PRN
  Administered 2017-07-21: 14 mL/h via EPIDURAL

## 2017-07-21 MED ORDER — OXYTOCIN 10 UNIT/ML IJ SOLN: 10.0000 [IU] | Freq: Once | INTRAMUSCULAR | Status: DC

## 2017-07-21 MED ORDER — LACTATED RINGERS IV SOLN: 500.0000 mL | Freq: Once | INTRAVENOUS | Status: DC

## 2017-07-21 MED ORDER — IBUPROFEN 600 MG PO TABS
600.0000 mg | ORAL_TABLET | Freq: Four times a day (QID) | ORAL | Status: DC
  Administered 2017-07-21 – 2017-07-22 (×5): 600 mg via ORAL
  Filled 2017-07-21 (×5): qty 1

## 2017-07-21 MED ORDER — LIDOCAINE HCL (PF) 1 % IJ SOLN
30.0000 mL | INTRAMUSCULAR | Status: DC | PRN
  Filled 2017-07-21: qty 30

## 2017-07-21 MED ORDER — OXYCODONE HCL 5 MG PO TABS: 10.0000 mg | ORAL_TABLET | ORAL | Status: DC | PRN

## 2017-07-21 MED ORDER — OXYCODONE-ACETAMINOPHEN 5-325 MG PO TABS: 2.0000 | ORAL_TABLET | ORAL | Status: DC | PRN

## 2017-07-21 NOTE — Anesthesia Postprocedure Evaluation (Signed)
Anesthesia Post Note  Patient: Jaime Hampton  Procedure(s) Performed: * No procedures listed *     Patient location during evaluation: Mother Baby Anesthesia Type: Epidural Level of consciousness: awake and alert and oriented Pain management: pain level controlled Vital Signs Assessment: post-procedure vital signs reviewed and stable Respiratory status: spontaneous breathing and nonlabored ventilation Cardiovascular status: stable Postop Assessment: no headache, patient able to bend at knees, no backache, no signs of nausea or vomiting, epidural receding and adequate PO intake Anesthetic complications: no    Last Vitals:  Vitals:   07/21/17 1030 07/21/17 1130  BP: 133/78 115/71  Pulse: 61 63  Resp: 18 18  Temp: 36.9 C 37.4 C  SpO2: 99% 98%    Last Pain:  Vitals:   07/21/17 1130  TempSrc: Oral  PainSc: 0-No pain   Pain Goal:                 [employer]

## 2017-07-21 NOTE — MAU Note (Signed)
Pt reports contractions. Dr. Cherly Hensen at bedside.

## 2017-07-21 NOTE — Anesthesia Procedure Notes (Signed)
Epidural Patient location during procedure: OB  Staffing Anesthesiologist: Lewie Loron Performed: anesthesiologist   Preanesthetic Checklist Completed: patient identified, pre-op evaluation, timeout performed, IV checked, risks and benefits discussed and monitors and equipment checked  Epidural Patient position: sitting Prep: site prepped and draped and DuraPrep Patient monitoring: heart rate, blood pressure and continuous pulse ox Approach: midline Location: L3-L4 Injection technique: LOR air and LOR saline  Needle:  Needle type: Tuohy  Needle gauge: 17 G Needle length: 9 cm Needle insertion depth: 5 cm Catheter type: closed end flexible Catheter size: 19 Gauge Catheter at skin depth: 10 cm Test dose: negative  Assessment Sensory level: T8 Events: blood not aspirated, injection not painful, no injection resistance, negative IV test and no paresthesia  Additional Notes Reason for block:procedure for pain

## 2017-07-21 NOTE — Lactation Note (Signed)
This note was copied from a baby's chart. Lactation Consultation Note  Patient Name: Jaime Hampton [id number] Date: 07/21/2017 Reason for consult: Initial assessment  Initial visit at 8 hours of life. Mom is a P2 who pumped & BO for 6 months w/her 1st child (she remembers being able to pump anywhere from 1.5 - 7 oz/session). Her 1st child required bili lights. Mom said that child would not return to the breast after having been supplemented with a bottle.  Mom feels that nursing is going well with "Kellan." She hears him gulping & she has been leaking for the last couple of weeks.   Mom has my # to call for latch check, if desired. Mom made aware of O/P services, breastfeeding support groups, community resources, and our phone # for post-discharge questions.    Lurline Hare [facility] 07/21/2017, 4:35 PM

## 2017-07-21 NOTE — H&P (Signed)
Jaime Hampton is a 36 y.o. female presenting @ 40 2/7 weeks with complaint of ctx as well as SROM blood tinged fluid (+) FM (-) GBS OB History    Gravida Para Term Preterm AB Living   3 1 1   1 1    SAB TAB Ectopic Multiple Live Births   1       1     Past Medical History:  Diagnosis Date  . Abnormal Pap smear   . Anxiety    Past Surgical History:  Procedure Laterality Date  . DILATION AND CURETTAGE OF UTERUS     Family History: family history includes Heart attack in her father; Hypertension in her father; Kidney Stones in her father; Migraines in her mother; Pulmonary embolism in her mother; Thrombocytopenia in her mother; Varicose Veins in her mother. Social History:  reports that she has never smoked. She has never used smokeless tobacco. She reports that she does not drink alcohol or use drugs.     Maternal Diabetes: No Genetic Screening: Normal Maternal Ultrasounds/Referrals: Normal Fetal Ultrasounds or other Referrals:  None Maternal Substance Abuse:  No Significant Maternal Medications:  None Significant Maternal Lab Results:  Lab values include: Group B Strep negative Other Comments:  kidney stones during preg  Review of Systems  All other systems reviewed and are negative.  History   Blood pressure 139/77, pulse 70, temperature 97.6 F (36.4 C), temperature source Oral, resp. rate 20, last menstrual period 10/12/2016, unknown if currently breastfeeding. Exam Physical Exam  Constitutional: She is oriented to person, place, and time. She appears well-developed and well-nourished.  HENT:  Surprenant: Atraumatic.  Eyes: EOM are normal.  Neck: Neck supple.  Cardiovascular: Normal rate.   Respiratory: Effort normal.  GI: Soft.  Musculoskeletal: She exhibits no edema.  Neurological: She is alert and oriented to person, place, and time.  Skin: Skin is warm and dry.   VE 4/90/-1 bloody show  Tracing: baseline 120 (+)occ small variable (+) early decel Ctx q 3-4  mins Prenatal labs: ABO, Rh: A/Positive/-- (01/11 0000) Antibody: Negative (01/11 0000) Rubella: Immune (01/11 0000) RPR: Nonreactive (01/11 0000)  HBsAg: Negative (01/11 0000)  HIV: Non-reactive (01/11 0000)  GBS: Negative (07/13 0000)   Assessment/Plan: Labor Postterm P) admit routine labs. Epidural. Pitocin augmentation   Jaime Hampton A 07/21/2017, 6:17 AM

## 2017-07-21 NOTE — Anesthesia Preprocedure Evaluation (Addendum)
Anesthesia Evaluation  Patient identified by MRN, date of birth, ID band Patient awake    Reviewed: Allergy & Precautions, NPO status , Patient's Chart, lab work & pertinent test results  Airway Mallampati: II  TM Distance: >3 FB Neck ROM: Full    Dental no notable dental hx.    Pulmonary neg pulmonary ROS,    Pulmonary exam normal breath sounds clear to auscultation       Cardiovascular negative cardio ROS Normal cardiovascular exam Rhythm:Regular Rate:Normal     Neuro/Psych PSYCHIATRIC DISORDERS Anxiety negative neurological ROS     GI/Hepatic negative GI ROS, Neg liver ROS,   Endo/Other  negative endocrine ROS  Renal/GU negative Renal ROS     Musculoskeletal negative musculoskeletal ROS (+)   Abdominal   Peds  Hematology negative hematology ROS (+) plt 242k   Anesthesia Other Findings   Reproductive/Obstetrics (+) Pregnancy                                                             Anesthesia Evaluation    Airway        Dental   Pulmonary           Cardiovascular      Neuro/Psych    GI/Hepatic   Endo/Other    Renal/GU      Musculoskeletal   Abdominal   Peds  Hematology plt 242k   Anesthesia Other Findings   Reproductive/Obstetrics (+) Pregnancy                             Anesthesia Physical Anesthesia Plan  ASA: II  Anesthesia Plan: Epidural   Post-op Pain Management:    Induction:   PONV Risk Score and Plan: Treatment may vary due to age or medical condition  Airway Management Planned: Natural Airway  Additional Equipment:   Intra-op Plan:   Post-operative Plan:   Informed Consent:   Plan Discussed with:   Anesthesia Plan Comments:         Anesthesia Quick Evaluation  Anesthesia Physical Anesthesia Plan  ASA: II  Anesthesia Plan: Epidural   Post-op Pain Management:    Induction:    PONV Risk Score and Plan: Treatment may vary due to age or medical condition  Airway Management Planned: Natural Airway  Additional Equipment:   Intra-op Plan:   Post-operative Plan:   Informed Consent: I have reviewed the patients History and Physical, chart, labs and discussed the procedure including the risks, benefits and alternatives for the proposed anesthesia with the patient or authorized representative who has indicated his/her understanding and acceptance.     Plan Discussed with:   Anesthesia Plan Comments:        Anesthesia Quick Evaluation

## 2017-07-22 LAB — CBC
HCT: 30 % — ABNORMAL LOW (ref 36.0–46.0)
Hemoglobin: 10.4 g/dL — ABNORMAL LOW (ref 12.0–15.0)
MCH: 31.6 pg (ref 26.0–34.0)
MCHC: 34.7 g/dL (ref 30.0–36.0)
MCV: 91.2 fL (ref 78.0–100.0)
PLATELETS: 194 10*3/uL (ref 150–400)
RBC: 3.29 MIL/uL — ABNORMAL LOW (ref 3.87–5.11)
RDW: 14.2 % (ref 11.5–15.5)
WBC: 10.2 10*3/uL (ref 4.0–10.5)

## 2017-07-22 MED ORDER — IBUPROFEN 600 MG PO TABS: 600.0000 mg | ORAL_TABLET | Freq: Four times a day (QID) | ORAL | 0 refills | Status: DC

## 2017-07-22 MED ORDER — BENZOCAINE-MENTHOL 20-0.5 % EX AERO: 1.0000 "application " | INHALATION_SPRAY | CUTANEOUS | Status: DC | PRN

## 2017-07-22 MED ORDER — COCONUT OIL OIL: 1.0000 "application " | TOPICAL_OIL | 0 refills | Status: DC | PRN

## 2017-07-22 NOTE — Discharge Summary (Signed)
Obstetric Discharge Summary Reason for Admission: onset of labor and rupture of membranes Prenatal Procedures: ultrasound Intrapartum Procedures: spontaneous vaginal delivery and epidural Postpartum Procedures: none Complications-Operative and Postpartum: 2nd degree perineal laceration,  left Sulcus;Vaginal Hemoglobin  Date Value Ref Range Status  07/22/2017 10.4 (L) 12.0 - 15.0 g/dL Final   HCT  Date Value Ref Range Status  07/22/2017 30.0 (L) 36.0 - 46.0 % Final    Physical Exam:  General: alert, cooperative and no distress Lochia: appropriate Uterine Fundus: firm Incision: healing well DVT Evaluation: No cords or calf tenderness. No significant calf/ankle edema.  Discharge Diagnoses: Term Pregnancy-delivered  Discharge Information: Date: 07/22/2017 Activity: pelvic rest Diet: routine Medications:  Allergies as of 07/22/2017   No Known Allergies     Medication List    STOP taking these medications   metroNIDAZOLE 0.75 % cream Commonly known as:  METROCREAM     TAKE these medications   acetaminophen 325 MG tablet Commonly known as:  TYLENOL Take 650 mg by mouth every 6 (six) hours as needed. For headache or pain   benzocaine-Menthol 20-0.5 % Aero Commonly known as:  DERMOPLAST Apply 1 application topically as needed for irritation (perineal discomfort).   coconut oil Oil Apply 1 application topically as needed.   Evening Primrose Oil 1000 MG Caps Take 1,000 capsules by mouth daily.   fluticasone 50 MCG/ACT nasal spray Commonly known as:  FLONASE Place 1 spray into both nostrils daily as needed for allergies.   hydrocortisone cream 1 % Apply 1 application topically daily as needed. For itching   ibuprofen 600 MG tablet Commonly known as:  ADVIL,MOTRIN Take 1 tablet (600 mg total) by mouth every 6 (six) hours.   prenatal multivitamin Tabs tablet Take 1 tablet by mouth daily.   ranitidine 150 MG tablet Commonly known as:  ZANTAC Take 150 mg by mouth  daily as needed for heartburn.      Condition: stable Instructions: refer to practice specific booklet Discharge to: home Follow-up Information    Maxie Better, MD. Schedule an appointment as soon as possible for a visit in 6 week(s).   Specialty:  Obstetrics and Gynecology Contact information: [address] [place] [address] [phone]           Newborn Data: Live born female Kellan Birth Weight: 7 lb 12.5 oz (3530 g) APGAR: 9, 9  Home with mother.  Neta Mends, CNM 07/22/2017, 9:43 AM

## 2017-07-22 NOTE — Lactation Note (Signed)
This note was copied from a baby's chart. Lactation Consultation Note  Patient Name: Jaime Hampton DHWYS'H Date: 07/22/2017 Reason for consult: Follow-up assessment   With this mom of a term baby, now 52 hours old. Mom reports tender nipples, and that baby has been cluster feeding since birth.  Mom has very small areolas and very long nipples. The baby was latched shallow. Mom unlatched the baby, I had her use cross cradle hold and she was able to get a much deeper and more comfortable latch. Mom's whole areola was covered by the baby's mouth this time.  On exam of baby's mouth, he has a lingual frenulum, imbedded in thick tissue, posterior and short. The frenulum blanches with tongue elevation. Despite this, Kellum has had more than adequate output so far, making feel Bfing is going well.  Mom has lots of easily expressed colostrum with HE.  I gave mom oral restriction resources, and advised her to call the [facility] clinic, and gave her the phone # to call for an o/p lactation consult, as needed.  Mom ended up needing to supplement her first baby with a bottle, and then pumped and bottle fed.The first baby would not latch once getting a bottle.   Dr. Alita Chyle was present and aware of the oral resrtiction information I gave the mom.    Maternal Data    Feeding Feeding Type: Breast Fed Length of feed:  (cluster feeding, latched when I wlaked in the room)  LATCH Score Latch:  (mom unlatched the baby, and she relatched with cross cradle, obtaining a deeper and more comfortable latch. )  Audible Swallowing: A few with stimulation (easily expressable colostrum from multiple sites)  Type of Nipple: Everted at rest and after stimulation (very long nipples)  Comfort (Breast/Nipple): Soft / non-tender  Hold (Positioning): Assistance needed to correctly position infant at breast and maintain latch.     Interventions Interventions: Breast feeding basics reviewed;Assisted with latch;Skin to  skin;Hand express;Adjust position;Support pillows;Position options  Lactation Tools Discussed/Used     Consult Status Consult Status: Complete Follow-up type: Call as needed    Alfred Levins 07/22/2017, 9:41 AM

## 2017-07-22 NOTE — Progress Notes (Signed)
PPD # 1 SVD Information for the patient's newborn:  Jaime Hampton, Jaime Hampton [[id number]]  female    breast feeding  / Circumcision planned today Baby name: Debbrah Alar  S:  Reports feeling well, desires DC home today.             Tolerating po/ No nausea or vomiting             Bleeding is light             Pain controlled with ibuprofen (OTC)             Up ad lib / ambulatory / voiding without difficulties        O:  A & O x 3, in no apparent distress              VS:  Vitals:   07/21/17 1130 07/21/17 1530 07/21/17 2330 07/22/17 0522  BP: 115/71 117/84 122/74 (!) 116/52  Pulse: 63 72 68 71  Resp: 18 18 18 18   Temp: 99.4 F (37.4 C) 98.9 F (37.2 C) 98.4 F (36.9 C) 98.1 F (36.7 C)  TempSrc: Oral Oral Oral Oral  SpO2: 98%  99% 99%  Weight:      Height:        LABS:  Recent Labs  07/21/17 0625 07/22/17 0503  WBC 12.6* 10.2  HGB 12.1 10.4*  HCT 35.2* 30.0*  PLT 242 194    Blood type: --/--/A POS, A POS (08/19 7371)  Rubella: Immune (01/11 0000)   I&O: I/O last 3 completed shifts: In: -  Out: 300 [Urine:100; Blood:200]          No intake/output data recorded.  Lungs: Clear and unlabored  Heart: regular rate and rhythm / no murmurs  Abdomen: soft, non-tender, non-distended             Fundus: firm, non-tender, U-1  Perineum: mild edema, repair intact  Lochia: scant  Extremities: no edema, no calf pain or tenderness    A/P: PPD # 1 36 y.o., [id number]   Principal Problem:   Postpartum care following vaginal delivery (8/19) Active Problems:   Obstetrical laceration: 2nd degree; left Sulcus;Vaginal;Perineal   Doing well - stable status  Routine post partum orders             DC home today w/ instructions  F/U at [facility] in 6 weeks and PRN     Neta Mends, MSN, CNM 07/22/2017, 9:05 AM

## 2017-07-22 NOTE — Progress Notes (Signed)
MOB was referred for history of depression/anxiety. * Referral screened out by Clinical Social Worker because none of the following criteria appear to apply: ~ History of anxiety/depression during this pregnancy, or of post-partum depression. ~ Diagnosis of anxiety and/or depression within last 3 years; No concerns noted in OB records.  OR * MOB's symptoms currently being treated with medication and/or therapy.  Please contact the Clinical Social Worker if needs arise, by MOB request, or if MOB scores 9 or greater/yes to question 10 on Edinburgh Postpartum Depression Screen.  Jaime Hampton, MSW, LCSW Clinical Social Work [phone] 

## 2017-07-23 ENCOUNTER — Inpatient Hospital Stay (HOSPITAL_COMMUNITY): Admission: RE | Admit: 2017-07-23 | Payer: BC Managed Care – PPO | Source: Ambulatory Visit

## 2018-02-28 DIAGNOSIS — L03213 Periorbital cellulitis: Secondary | ICD-10-CM | POA: Diagnosis not present

## 2018-02-28 DIAGNOSIS — H10022 Other mucopurulent conjunctivitis, left eye: Secondary | ICD-10-CM | POA: Diagnosis not present

## 2018-03-11 DIAGNOSIS — H00034 Abscess of left upper eyelid: Secondary | ICD-10-CM | POA: Diagnosis not present

## 2018-03-11 DIAGNOSIS — H00035 Abscess of left lower eyelid: Secondary | ICD-10-CM | POA: Diagnosis not present

## 2018-03-24 IMAGING — US US ABDOMEN LIMITED
1 series · 14 of 25 positions shown · non-contrast
Comparison: None.

CLINICAL DATA: Twenty-one week pregnancy. Right lower quadrant
abdominal pain.

EXAM:
LIMITED ABDOMINAL ULTRASOUND
TECHNIQUE: Gray scale imaging of the right lower quadrant and right central
abdomen was performed to evaluate for suspected appendicitis.
Standard imaging planes and graded compression technique were
utilized.

[Series 1: us abdomen limited · 0.12mm/px · 14 of 26 slices shown]
[im 1/26]
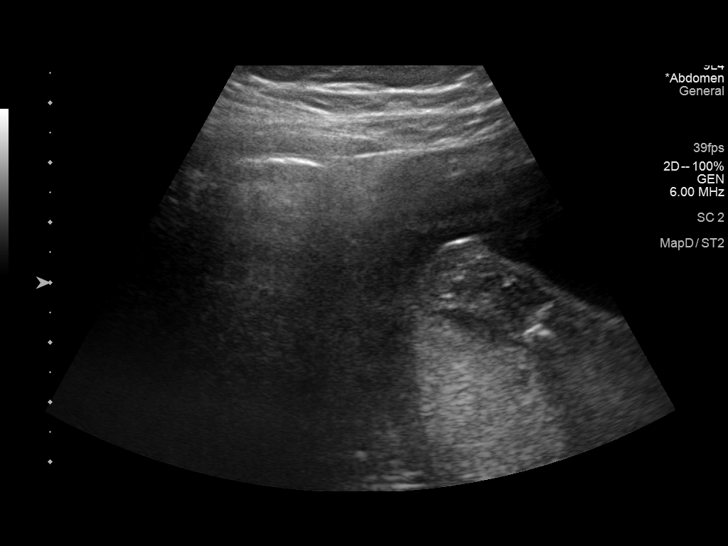
[im 3/26]
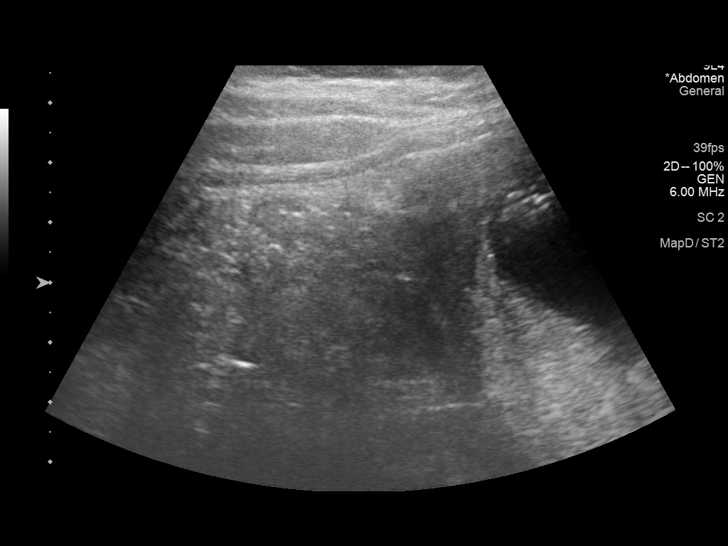
[im 5/26]
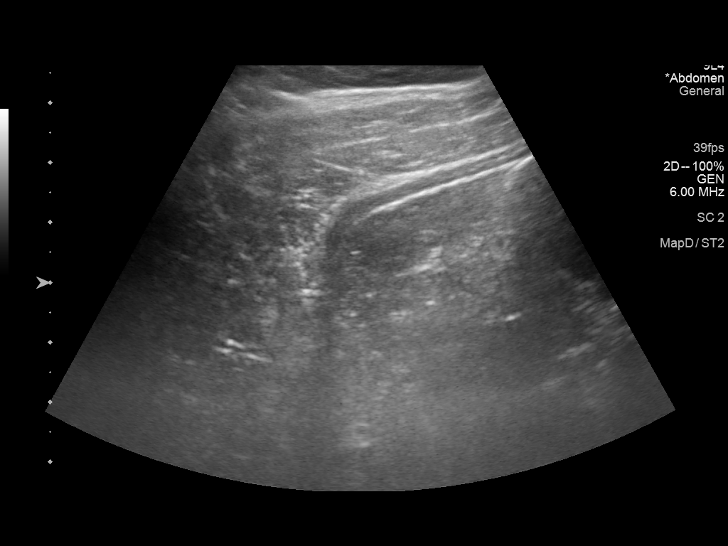
[im 7/26]
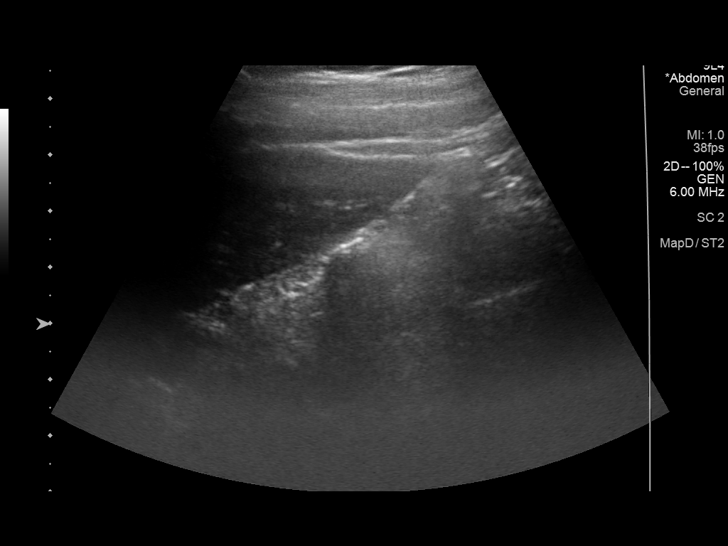
[im 9/26]
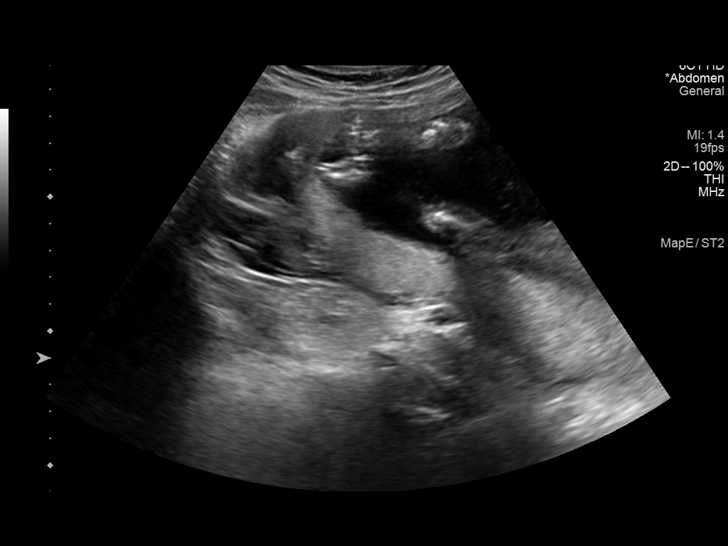
[im 10/26]
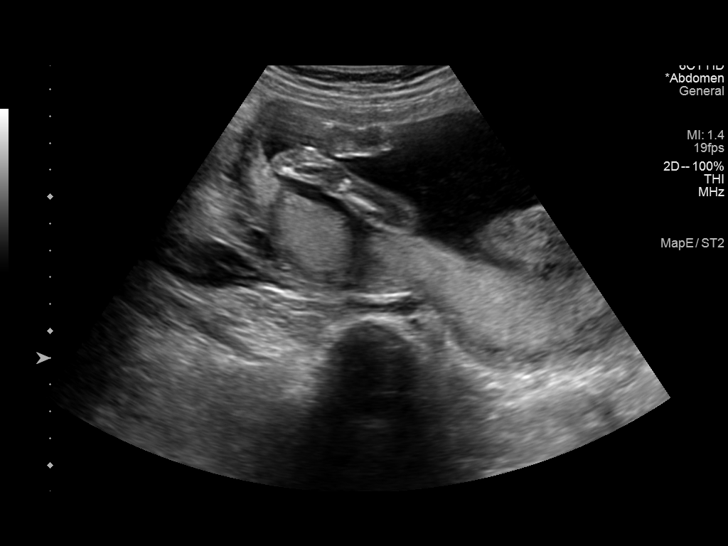
[im 12/26]
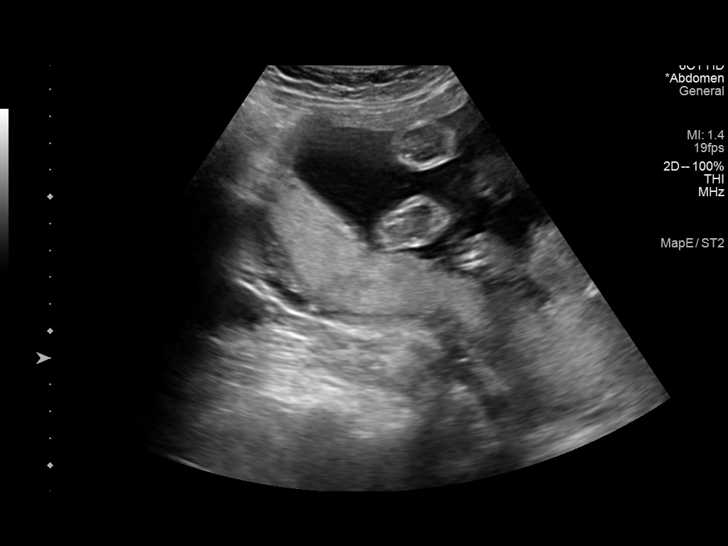
[im 14/26]
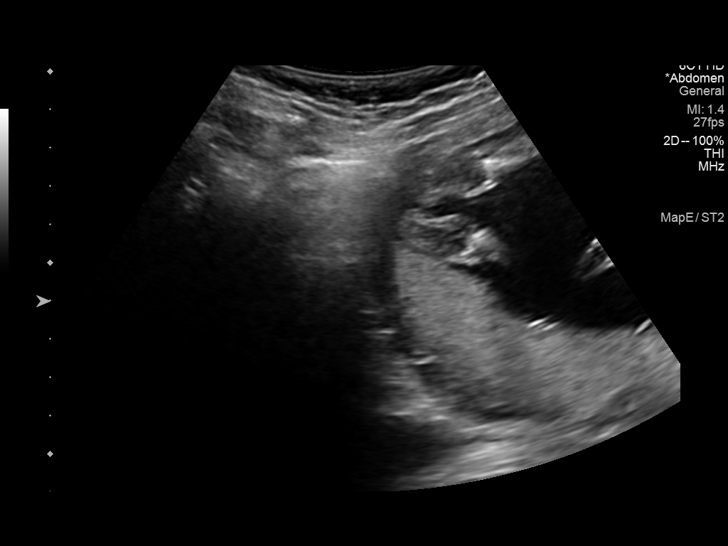
[im 16/26]
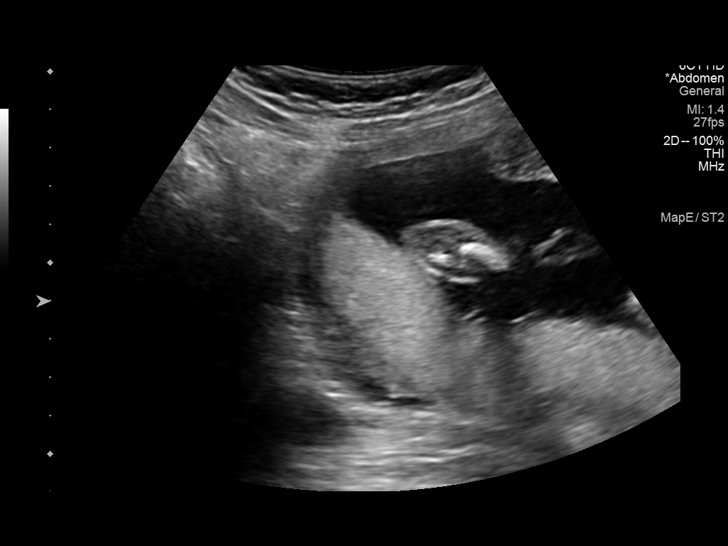
[im 17/26]
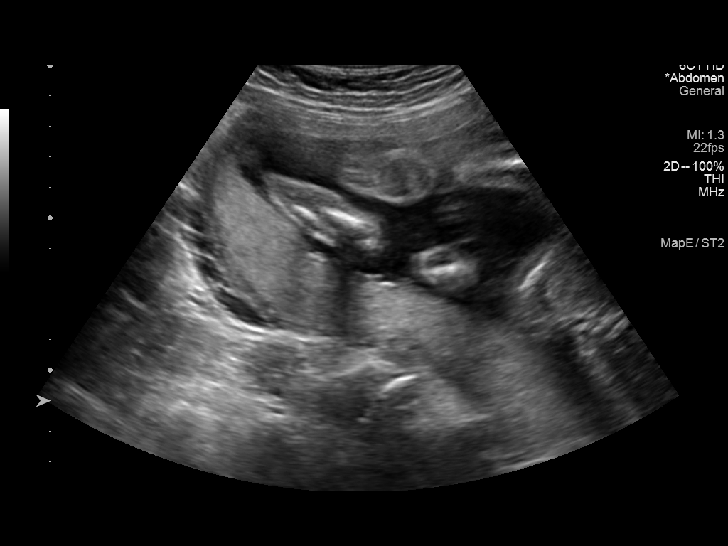
[im 19/26]
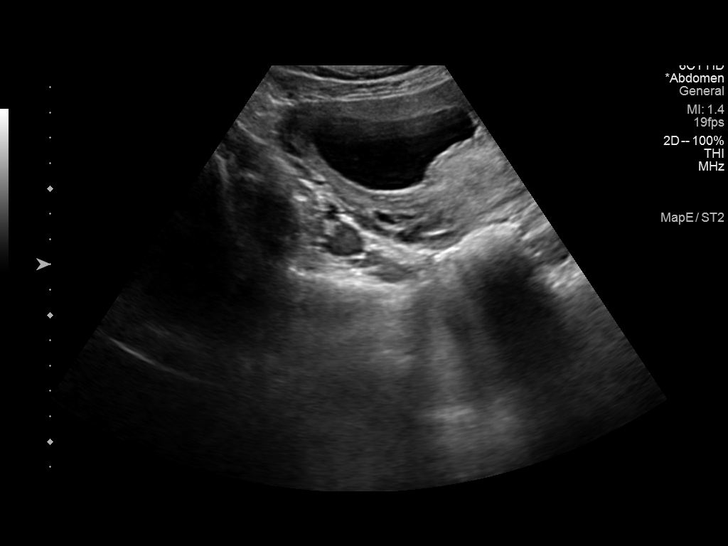
[im 21/26]
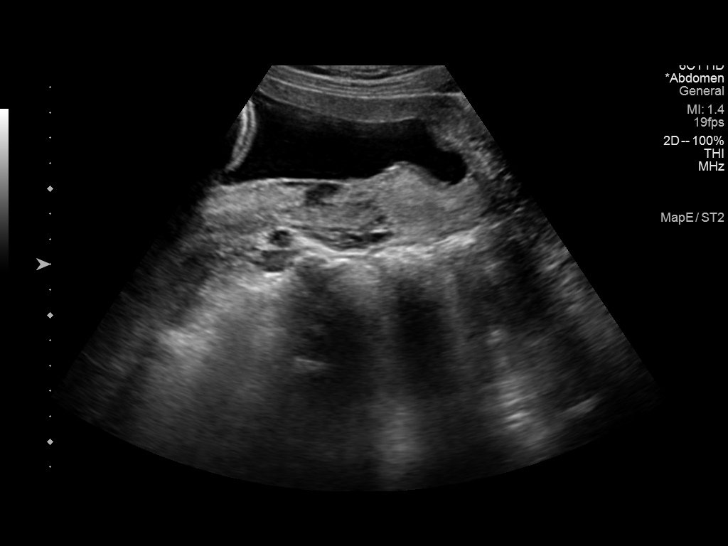
[im 23/26]
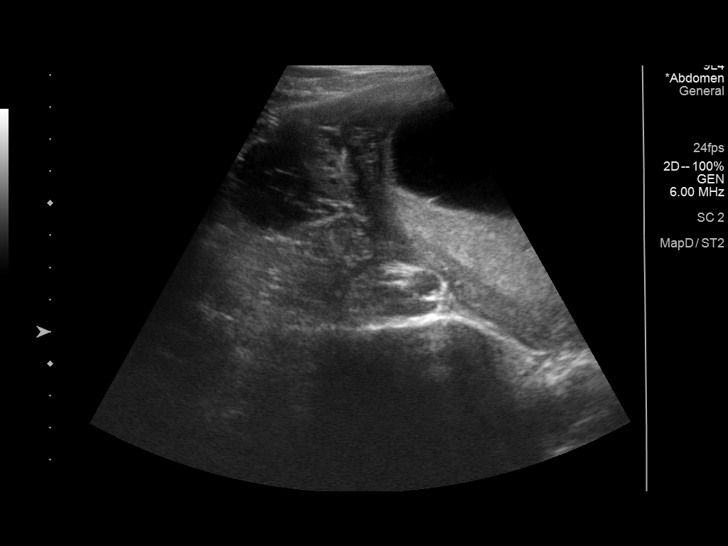
[im 26/26]
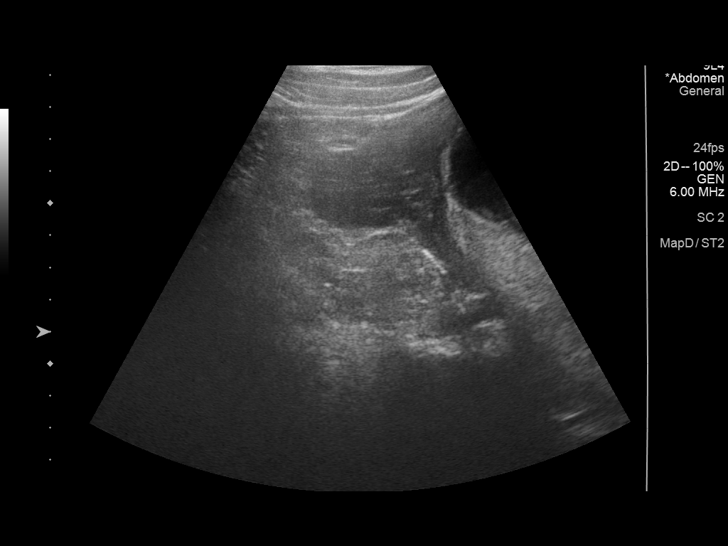

[14 of 25 positions shown; findings below may reference images not displayed]

FINDINGS: The appendix is not visualized.

Ancillary findings: Gravid uterus, not studied in detail. No
discernible abnormal finding as seen.

Factors affecting image quality: 21 week gestation
IMPRESSION: The appendix is not visualized, either normal or abnormal. No
inflamed bowel is evident in the region, but anatomy is distorted by
21 week gestation.

## 2018-05-27 ENCOUNTER — Encounter: Payer: Self-pay | Admitting: Family Medicine

## 2018-05-27 ENCOUNTER — Ambulatory Visit (INDEPENDENT_AMBULATORY_CARE_PROVIDER_SITE_OTHER): Payer: 59 | Admitting: Family Medicine

## 2018-05-27 ENCOUNTER — Other Ambulatory Visit: Payer: Self-pay

## 2018-05-27 VITALS — BP 118/70 | HR 96 | Temp 98.1°F | Resp 16 | Ht 64.5 in | Wt 125.2 lb

## 2018-05-27 DIAGNOSIS — L719 Rosacea, unspecified: Secondary | ICD-10-CM

## 2018-05-27 DIAGNOSIS — K219 Gastro-esophageal reflux disease without esophagitis: Secondary | ICD-10-CM

## 2018-05-27 DIAGNOSIS — Z0000 Encounter for general adult medical examination without abnormal findings: Secondary | ICD-10-CM | POA: Diagnosis not present

## 2018-05-27 DIAGNOSIS — F411 Generalized anxiety disorder: Secondary | ICD-10-CM | POA: Insufficient documentation

## 2018-05-27 DIAGNOSIS — Z3041 Encounter for surveillance of contraceptive pills: Secondary | ICD-10-CM | POA: Insufficient documentation

## 2018-05-27 HISTORY — DX: Encounter for surveillance of contraceptive pills: Z30.41

## 2018-05-27 HISTORY — DX: Rosacea, unspecified: L71.9

## 2018-05-27 LAB — COMPREHENSIVE METABOLIC PANEL
ALT: 11 U/L (ref 0–35)
AST: 13 U/L (ref 0–37)
Albumin: 4.2 g/dL (ref 3.5–5.2)
Alkaline Phosphatase: 29 U/L — ABNORMAL LOW (ref 39–117)
BUN: 14 mg/dL (ref 6–23)
CALCIUM: 9.2 mg/dL (ref 8.4–10.5)
CHLORIDE: 104 meq/L (ref 96–112)
CO2: 27 meq/L (ref 19–32)
CREATININE: 0.75 mg/dL (ref 0.40–1.20)
GFR: 91.56 mL/min (ref >60.00)
Glucose, Bld: 77 mg/dL (ref 70–99)
Potassium: 4.3 mEq/L (ref 3.5–5.1)
Sodium: 138 mEq/L (ref 135–145)
Total Bilirubin: 0.6 mg/dL (ref 0.2–1.2)
Total Protein: 6.5 g/dL (ref 6.0–8.3)

## 2018-05-27 LAB — CBC WITH DIFFERENTIAL/PLATELET
BASOS PCT: 0.7 % (ref 0.0–3.0)
Basophils Absolute: 0.1 10*3/uL (ref 0.0–0.1)
EOS ABS: 0.4 10*3/uL (ref 0.0–0.7)
Eosinophils Relative: 3.9 % (ref 0.0–5.0)
HEMATOCRIT: 40.4 % (ref 36.0–46.0)
Hemoglobin: 13.7 g/dL (ref 12.0–15.0)
LYMPHS ABS: 2.3 10*3/uL (ref 0.7–4.0)
Lymphocytes Relative: 24.2 % (ref 12.0–46.0)
MCHC: 33.9 g/dL (ref 30.0–36.0)
MCV: 92.9 fl (ref 78.0–100.0)
MONO ABS: 0.8 10*3/uL (ref 0.1–1.0)
Monocytes Relative: 8.7 % (ref 3.0–12.0)
NEUTROS ABS: 5.9 10*3/uL (ref 1.4–7.7)
Neutrophils Relative %: 62.5 % (ref 43.0–77.0)
PLATELETS: 306 10*3/uL (ref 150.0–400.0)
RBC: 4.35 Mil/uL (ref 3.87–5.11)
RDW: 13.3 % (ref 11.5–15.5)
WBC: 9.5 10*3/uL (ref 4.0–10.5)

## 2018-05-27 LAB — LIPID PANEL
Cholesterol: 209 mg/dL — ABNORMAL HIGH (ref 0–200)
HDL: 65.9 mg/dL (ref >39.00)
LDL CALC: 110 mg/dL — AB (ref 0–99)
NonHDL: 142.99
TRIGLYCERIDES: 166 mg/dL — AB (ref 0.0–149.0)
Total CHOL/HDL Ratio: 3
VLDL: 33.2 mg/dL (ref 0.0–40.0)

## 2018-05-27 NOTE — Progress Notes (Signed)
Subjective  Chief Complaint  Patient presents with  . Establish Care    Doing well    HPI: Jaime CluckBarbara S Creamer is a 37 y.o. female who presents [facility] today for a Female Wellness Visit.  New patient to establish care.  Last female wellness October 2018, postpartum physical with GYN.  Married, psychotherapist, 2 sons.  Healthy lifestyle  Wellness Visit: annual visit with health maintenance review and exam without Pap   Reviewed past medical history: Significant for rosacea managed by doxycycline low-dose daily and metronidazole cream.  History of mild anxiety managed behaviorally.  Chronic sleep problems worsened by having small children.  Uses melatonin as needed.  Lives healthy lifestyle.  Does report history of LPR with occasional odynophagia.  Symptoms are very intermittent, lasting from 1 to 3 days.  They have been ongoing for several years.  Evaluated by ENT.  Has use Zantac as needed.  Endorses intermittent GERD symptoms as well.  No melena, hematemesis, chronic abdominal pain, dysphagia.  She is a non-smoker.  No active symptoms over the last 6 months.  Has never had endoscopy nor imaging testing done.  Assessment  1. Annual physical exam   2. Rosacea   3. Laryngopharyngeal reflux (LPR)      Plan  Female Wellness Visit:  Age appropriate Health Maintenance and Prevention measures were discussed with patient. Included topics are cancer screening recommendations, ways to keep healthy (see AVS) including dietary and exercise recommendations, regular eye and dental care, use of seat belts, and avoidance of moderate alcohol use and tobacco use.   BMI: discussed patient's BMI and encouraged positive lifestyle modifications to help get to or maintain a target BMI.  HM needs and immunizations were addressed and ordered. See below for orders. See HM and immunization section for updates.  Routine labs and screening tests ordered including cmp, cbc and  lipids where appropriate.  Discussed recommendations regarding Vit D and calcium supplementation (see AVS)  LPR/GERD: Zantac as needed.  Monitor for worsening symptoms.  Rosacea, managed with topicals and antibiotics.  Has seen dermatology in the past.  Follow up: Return in about 1 year (around 05/28/2019) for complete physical.   Orders Placed This Encounter  Procedures  . CBC with Differential/Platelet  . Comprehensive metabolic panel  . Lipid panel   No orders of the defined types were placed in this encounter.     Lifestyle: Body mass index is 21.16 kg/m. Wt Readings from Last 3 Encounters:  05/27/18 125 lb 3.2 oz (56.8 kg)  07/21/17 152 lb (68.9 kg)  07/20/17 152 lb (68.9 kg)   Diet: low fat Exercise: frequently,  Need for contraception: Yes, OCP (estrogen/progesterone)  Patient Active Problem List   Diagnosis Date Noted  . Oral contraceptive use 05/27/2018    Priority: Medium  . GAD (generalized anxiety disorder) 05/27/2018    Priority: Medium    Treated short term with buspar; never SSRI   . Laryngopharyngeal reflux (LPR) 04/26/2016    Priority: Medium  . Persistent insomnia 07/13/2013    Priority: Medium    melatonin   . Rosacea 05/27/2018    Priority: Low  . TMJ pain dysfunction syndrome 04/26/2016    Priority: Low   Health Maintenance  Topic Date Due  . INFLUENZA VACCINE  07/03/2018  . PAP SMEAR  09/02/2022  . TETANUS/TDAP  07/04/2027  . HIV Screening  Completed   Immunization History  Administered Date(s) Administered  . Influenza Split 11/04/2007  . Influenza,inj,quad,  With Preservative 09/30/2013  . Influenza-Unspecified 09/02/2014  . Tdap 03/08/2012, 07/03/2017   We updated and reviewed the patient's past history in detail and it is documented below. Allergies: Patient has No Known Allergies. Past Medical History Patient  has a past medical history of Abnormal Pap smear, Allergy, Anxiety, and Rosacea (05/27/2018). Past Surgical  History Patient  has a past surgical history that includes Dilation and curettage of uterus. Family History: Patient family history includes Heart attack in her father; Hypertension in her father; Kidney Stones in her father; Migraines in her mother; Pulmonary embolism in her mother; Thrombocytopenia in her mother; Varicose Veins in her mother. Social History:  Patient  reports that she has never smoked. She has never used smokeless tobacco. She reports that she does not drink alcohol or use drugs.  Review of Systems: Constitutional: negative for fever or malaise Ophthalmic: negative for photophobia, double vision or loss of vision Cardiovascular: negative for chest pain, dyspnea on exertion, or new LE swelling Respiratory: negative for SOB or persistent cough Gastrointestinal: negative for abdominal pain, change in bowel habits or melena Genitourinary: negative for dysuria or gross hematuria, no abnormal uterine bleeding or disharge Musculoskeletal: negative for new gait disturbance or muscular weakness Integumentary: negative for new or persistent rashes, no breast lumps Neurological: negative for TIA or stroke symptoms Psychiatric: negative for SI or delusions Allergic/Immunologic: negative for hives Patient Care Team    Relationship Specialty Notifications Start End  Willow Ora, MD PCP - General Family Medicine  05/27/18   Maxie Better, MD Consulting Physician Obstetrics and Gynecology  05/27/18     Objective  Vitals: BP 118/70   Pulse 96   Temp 98.1 F (36.7 C) (Oral)   Resp 16   Ht 5' 4.5" (1.638 m)   Wt 125 lb 3.2 oz (56.8 kg)   SpO2 98%   BMI 21.16 kg/m  General:  Well developed, well nourished, no acute distress, healthy-appearing Psych:  Alert and orientedx3,normal mood and affect HEENT:  Normocephalic, atraumatic, non-icteric sclera, PERRL, oropharynx is clear without mass or exudate, supple neck without adenopathy, mass or thyromegaly Cardiovascular:   Normal S1, S2, RRR without gallop, rub or murmur, nondisplaced PMI Respiratory:  Good breath sounds bilaterally, CTAB with normal respiratory effort Gastrointestinal: normal bowel sounds, soft, non-tender, no noted masses. No HSM MSK: no deformities, contusions. Joints are without erythema or swelling. Spine and CVA region are nontender Skin:  Warm, no rashes or suspicious lesions noted Neurologic:    Mental status is normal. CN 2-11 are normal. Gross motor and sensory exams are normal. Normal gait. No tremor   Commons side effects, risks, benefits, and alternatives for medications and treatment plan prescribed today were discussed, and the patient expressed understanding of the given instructions. Patient is instructed to call or message via MyChart if he/she has any questions or concerns regarding our treatment plan. No barriers to understanding were identified. We discussed Red Flag symptoms and signs in detail. Patient expressed understanding regarding what to do in case of urgent or emergency type symptoms.   Medication list was reconciled, printed and provided to the patient in AVS. Patient instructions and summary information was reviewed with the patient as documented in the AVS. This note was prepared with assistance of Dragon voice recognition software. Occasional wrong-word or sound-a-like substitutions may have occurred due to the inherent limitations of voice recognition software

## 2018-05-27 NOTE — Patient Instructions (Signed)
Please return in 12 months for your annual complete physical; please come fasting.  I will release your lab results to you on your MyChart account with further instructions. Please reply with any questions.    It was a pleasure meeting you today! Thank you for choosing us to meet your healthcare needs! I truly look forward to working with you. If you have any questions or concerns, please send me a message via Mychart or call the office at [phone].  Please do these things to maintain good health!   Exercise at least 30-45 minutes a day,  4-5 days a week.   Eat a low-fat diet with lots of fruits and vegetables, up to 7-9 servings per day.  Drink plenty of water daily. Try to drink 8 8oz glasses per day.  Seatbelts can save your life. Always wear your seatbelt.  Place Smoke Detectors on every level of your home and check batteries every year.  Schedule an appointment with an eye doctor for an eye exam every 1-2 years  Safe sex - use condoms to protect yourself from STDs if you could be exposed to these types of infections. Use birth control if you do not want to become pregnant and are sexually active.  Avoid heavy alcohol use. If you drink, keep it to less than 2 drinks/day and not every day.  Health Care Power of Attorney.  Choose someone you trust that could speak for you if you became unable to speak for yourself.  Depression is common in our stressful world.If you're feeling down or losing interest in things you normally enjoy, please come in for a visit.  If anyone is threatening or hurting you, please get help. Physical or Emotional Violence is never OK.  

## 2018-05-28 ENCOUNTER — Encounter: Payer: Self-pay | Admitting: Family Medicine

## 2018-05-28 NOTE — Progress Notes (Signed)
I have reviewed results. Normal. Patient notified by letter. Please see letter for details. 

## 2018-05-28 NOTE — Progress Notes (Signed)
Lab results mailed to patient in letter. Normal results. No action / follow up needed on these results.  

## 2018-08-01 ENCOUNTER — Ambulatory Visit (INDEPENDENT_AMBULATORY_CARE_PROVIDER_SITE_OTHER): Payer: 59 | Admitting: Physician Assistant

## 2018-08-01 ENCOUNTER — Other Ambulatory Visit: Payer: Self-pay

## 2018-08-01 ENCOUNTER — Encounter: Payer: Self-pay | Admitting: Physician Assistant

## 2018-08-01 VITALS — BP 104/70 | HR 76 | Temp 97.7°F | Resp 14 | Ht 65.0 in | Wt 127.0 lb

## 2018-08-01 DIAGNOSIS — R1013 Epigastric pain: Secondary | ICD-10-CM | POA: Diagnosis not present

## 2018-08-01 MED ORDER — GI COCKTAIL ~~LOC~~
30.0000 mL | Freq: Once | ORAL | Status: AC
Start: 2018-08-01 — End: 2018-08-01
  Administered 2018-08-01: 30 mL via ORAL

## 2018-08-01 MED ORDER — PANTOPRAZOLE SODIUM 40 MG PO TBEC: 40.0000 mg | DELAYED_RELEASE_TABLET | Freq: Every day | ORAL | 0 refills | Status: DC

## 2018-08-01 NOTE — Progress Notes (Signed)
Patient presents to clinic today c/o fatigue, heart burn and a globus sensation in her throat since this morning. Notes symptoms have waxed and waned but seem worse after meals. Had a heavy pasta dinner last night. Denies nausea/vomiting or stool changes. Denies fever, chills. Has history of GERD but notes this is worse. Not taking anything on a regular basis. Denies recent alcohol consumption or NSAID use.    Past Medical History:  Diagnosis Date  . Abnormal Pap smear   . Allergy   . Anxiety   . Rosacea 05/27/2018    Current Outpatient Medications on File Prior to Visit  Medication Sig Dispense Refill  . fluticasone (FLONASE) 50 MCG/ACT nasal spray Place 1 spray into both nostrils daily as needed for allergies.     . hydrocortisone cream 1 % Apply 1 application topically daily as needed. For itching    . ibuprofen (ADVIL,MOTRIN) 600 MG tablet Take 1 tablet (600 mg total) by mouth every 6 (six) hours. 30 tablet 0  . metroNIDAZOLE-Cleanser 0.75 % (Cream) KIT Apply topically.    . SPRINTEC 28 0.25-35 MG-MCG tablet      No current facility-administered medications on file prior to visit.     No Known Allergies  Family History  Problem Relation Age of Onset  . Varicose Veins Mother   . Thrombocytopenia Mother   . Pulmonary embolism Mother   . Migraines Mother   . Kidney Stones Father   . Hypertension Father   . Heart attack Father   . Hyperlipidemia Father   . Heart disease Maternal Grandmother   . Diabetes Maternal Grandfather   . Heart attack Maternal Grandfather   . Heart disease Maternal Grandfather   . Diabetes Paternal Grandfather   . Anesthesia problems Neg Hx     Social History   Socioeconomic History  . Marital status: Married    Spouse name: matthew  . Number of children: 2  . Years of education: Not on file  . Highest education level: Not on file  Occupational History  . 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 Financial resource strain: Not on file  . Food insecurity:    Worry: Not on file    Inability: Not on file  . Transportation needs:    Medical: Not on file    Non-medical: Not on file  Tobacco Use  . Smoking status: Never Smoker  . Smokeless tobacco: Never Used  Substance and Sexual Activity  . Alcohol use: No  . Drug use: No  . Sexual activity: Yes    Birth control/protection: None  Lifestyle  . Physical activity:    Days per week: Not on file    Minutes per session: Not on file  . Stress: Not on file  Relationships  . Social connections:    Talks on phone: Not on file    Gets together: Not on file    Attends religious service: Not on file    Active member of club or organization: Not on file    Attends meetings of clubs or organizations: Not on file    Relationship status: Not on file  Other Topics Concern  . Not on file  Social History Narrative  . Not on file    Review of Systems - See HPI.  All other ROS are negative.  BP 104/70   Pulse 76   Temp 97.7 F (36.5 C) (Oral)   Resp 14  Ht _0  (1.651 m)   Wt 127 lb (57.6 kg)   SpO2 99%   Breastfeeding? No   BMI 21.13 kg/m   Physical Exam  Constitutional: She is oriented to person, place, and time. She appears well-developed and well-nourished.  HENT:  [place]: Normocephalic and atraumatic.  Mouth/Throat: Oropharynx is clear and moist.  Cardiovascular: Normal rate, regular rhythm and intact distal pulses.  Pulmonary/Chest: Effort normal and breath sounds normal.  Abdominal: Normal appearance and bowel sounds are normal. There is tenderness in the epigastric area and left upper quadrant. There is no rebound, no guarding and negative Murphy's sign.  Neurological: She is alert and oriented to person, place, and time.  Psychiatric: She has a normal mood and affect.  Vitals reviewed.   Recent Results (from the past 2160 hour(s))  CBC with Differential/Platelet     Status: None    Collection Time: 05/27/18  8:53 AM  Result Value Ref Range   WBC 9.5 4.0 - 10.5 K/uL   RBC 4.35 3.87 - 5.11 Mil/uL   Hemoglobin 13.7 12.0 - 15.0 g/dL   HCT 40.4 36.0 - 46.0 %   MCV 92.9 78.0 - 100.0 fl   MCHC 33.9 30.0 - 36.0 g/dL   RDW 13.3 11.5 - 15.5 %   Platelets 306.0 150.0 - 400.0 K/uL   Neutrophils Relative % 62.5 43.0 - 77.0 %   Lymphocytes Relative 24.2 12.0 - 46.0 %   Monocytes Relative 8.7 3.0 - 12.0 %   Eosinophils Relative 3.9 0.0 - 5.0 %   Basophils Relative 0.7 0.0 - 3.0 %   Neutro Abs 5.9 1.4 - 7.7 K/uL   Lymphs Abs 2.3 0.7 - 4.0 K/uL   Monocytes Absolute 0.8 0.1 - 1.0 K/uL   Eosinophils Absolute 0.4 0.0 - 0.7 K/uL   Basophils Absolute 0.1 0.0 - 0.1 K/uL  Comprehensive metabolic panel     Status: Abnormal   Collection Time: 05/27/18  8:53 AM  Result Value Ref Range   Sodium 138 135 - 145 mEq/L   Potassium 4.3 3.5 - 5.1 mEq/L   Chloride 104 96 - 112 mEq/L   CO2 27 19 - 32 mEq/L   Glucose, Bld 77 70 - 99 mg/dL   BUN 14 6 - 23 mg/dL   Creatinine, Ser 0.75 0.40 - 1.20 mg/dL   Total Bilirubin 0.6 0.2 - 1.2 mg/dL   Alkaline Phosphatase 29 (L) 39 - 117 U/L   AST 13 0 - 37 U/L   ALT 11 0 - 35 U/L   Total Protein 6.5 6.0 - 8.3 g/dL   Albumin 4.2 3.5 - 5.2 g/dL   Calcium 9.2 8.4 - 10.5 mg/dL   GFR 91.56 >60.00 mL/min  Lipid panel     Status: Abnormal   Collection Time: 05/27/18  8:53 AM  Result Value Ref Range   Cholesterol 209 (H) 0 - 200 mg/dL    Comment: ATP III Classification       Desirable:  < 200 mg/dL               Borderline High:  200 - 239 mg/dL          High:  > = 240 mg/dL   Triglycerides 166.0 (H) 0.0 - 149.0 mg/dL    Comment: Normal:  <150 mg/dLBorderline High:  150 - 199 mg/dL   HDL 65.90 >39.00 mg/dL   VLDL 33.2 0.0 - 40.0 mg/dL   LDL Cholesterol 110 (H) 0 - 99 mg/dL   Total  CHOL/HDL Ratio 3     Comment:                Men          Women1/2 Average Risk     3.4          3.3Average Risk          5.0          4.42X Average Risk          9.6           7.13X Average Risk          15.0          11.0                       NonHDL 142.99     Comment: NOTE:  Non-HDL goal should be 30 mg/dL higher than patient's LDL goal (i.e. LDL goal of < 70 mg/dL, would have non-HDL goal of < 100 mg/dL)    Assessment/Plan: 1. Epigastric pain Symptoms consistent with GERD and inflammation 2/2 reflux. GI cocktail given with improvement in symptoms. Will start Protonix. Dietary recommendations and supportive measures reviewed. Follow-up if not continuing to resolve.  - gi cocktail (Maalox,Lidocaine,Donnatal)    Leeanne Rio, PA-C

## 2018-08-01 NOTE — Patient Instructions (Signed)
Please keep hydrated. Avoid late night eating.  Elevate the [place] of your bed. Take the Protonix daily over the next 4-5 days. Can take longer if noting recurrence of reflux. Consider starting a daily probiotic.  Follow-up if symptoms are not resolving.   Food Choices for Gastroesophageal Reflux Disease, Adult When you have gastroesophageal reflux disease (GERD), the foods you eat and your eating habits are very important. Choosing the right foods can help ease your discomfort. What guidelines do I need to follow?  Choose fruits, vegetables, whole grains, and low-fat dairy products.  Choose low-fat meat, fish, and poultry.  Limit fats such as oils, salad dressings, butter, nuts, and avocado.  Keep a food diary. This helps you identify foods that cause symptoms.  Avoid foods that cause symptoms. These may be different for everyone.  Eat small meals often instead of 3 large meals a day.  Eat your meals slowly, in a place where you are relaxed.  Limit fried foods.  Cook foods using methods other than frying.  Avoid drinking alcohol.  Avoid drinking large amounts of liquids with your meals.  Avoid bending over or lying down until 2-3 hours after eating. What foods are not recommended? These are some foods and drinks that may make your symptoms worse: Vegetables Tomatoes. Tomato juice. Tomato and spaghetti sauce. Chili peppers. Onion and garlic. Horseradish. Fruits Oranges, grapefruit, and lemon (fruit and juice). Meats High-fat meats, fish, and poultry. This includes hot dogs, ribs, ham, sausage, salami, and bacon. Dairy Whole milk and chocolate milk. Sour cream. Cream. Butter. Ice cream. Cream cheese. Drinks Coffee and tea. Bubbly (carbonated) drinks or energy drinks. Condiments Hot sauce. Barbecue sauce. Sweets/Desserts Chocolate and cocoa. Donuts. Peppermint and spearmint. Fats and Oils High-fat foods. This includes JamaicaFrench fries and potato chips. Other Vinegar.  Strong spices. This includes black pepper, white pepper, red pepper, cayenne, curry powder, cloves, ginger, and chili powder. The items listed above may not be a complete list of foods and drinks to avoid. Contact your dietitian for more information. This information is not intended to replace advice given to you by your health care provider. Make sure you discuss any questions you have with your health care provider. Document Released: 05/20/2012 Document Revised: 04/26/2016 Document Reviewed: 09/23/2013 Elsevier Interactive Patient Education  2017 [employer]

## 2018-08-27 ENCOUNTER — Other Ambulatory Visit: Payer: Self-pay | Admitting: Physician Assistant

## 2018-10-21 DIAGNOSIS — Z23 Encounter for immunization: Secondary | ICD-10-CM | POA: Diagnosis not present

## 2018-10-21 DIAGNOSIS — L300 Nummular dermatitis: Secondary | ICD-10-CM | POA: Diagnosis not present

## 2018-11-18 DIAGNOSIS — L309 Dermatitis, unspecified: Secondary | ICD-10-CM | POA: Diagnosis not present

## 2018-11-18 DIAGNOSIS — Z23 Encounter for immunization: Secondary | ICD-10-CM | POA: Diagnosis not present

## 2018-12-19 ENCOUNTER — Ambulatory Visit: Payer: Self-pay | Admitting: Family Medicine

## 2018-12-23 ENCOUNTER — Ambulatory Visit: Payer: Self-pay | Admitting: Family Medicine

## 2019-01-08 ENCOUNTER — Encounter: Payer: Self-pay | Admitting: Family Medicine

## 2019-01-08 ENCOUNTER — Ambulatory Visit (INDEPENDENT_AMBULATORY_CARE_PROVIDER_SITE_OTHER): Payer: No Typology Code available for payment source | Admitting: Family Medicine

## 2019-01-08 ENCOUNTER — Other Ambulatory Visit: Payer: Self-pay

## 2019-01-08 VITALS — BP 122/76 | HR 106 | Temp 99.8°F | Resp 16 | Ht 65.0 in | Wt 129.6 lb

## 2019-01-08 DIAGNOSIS — R6889 Other general symptoms and signs: Secondary | ICD-10-CM

## 2019-01-08 DIAGNOSIS — J029 Acute pharyngitis, unspecified: Secondary | ICD-10-CM

## 2019-01-08 DIAGNOSIS — B349 Viral infection, unspecified: Secondary | ICD-10-CM

## 2019-01-08 LAB — POCT INFLUENZA A/B
INFLUENZA B, POC: NEGATIVE
Influenza A, POC: NEGATIVE

## 2019-01-08 LAB — POCT RAPID STREP A (OFFICE): RAPID STREP A SCREEN: NEGATIVE

## 2019-01-08 MED ORDER — GUAIFENESIN-CODEINE 100-10 MG/5ML PO SOLN: 5.0000 mL | Freq: Four times a day (QID) | ORAL | 0 refills | Status: DC | PRN

## 2019-01-08 NOTE — Patient Instructions (Signed)
Please follow up if symptoms do not improve or as needed.    Viral Illness, Adult Viruses are tiny germs that can get into a person's body and cause illness. There are many different types of viruses, and they cause many types of illness. Viral illnesses can range from mild to severe. They can affect various parts of the body. Common illnesses that are caused by a virus include colds and the flu. Viral illnesses also include serious conditions such as HIV/AIDS (human immunodeficiency virus/acquired immunodeficiency syndrome). A few viruses have been linked to certain cancers. What are the causes? Many types of viruses can cause illness. Viruses invade cells in your body, multiply, and cause the infected cells to malfunction or die. When the cell dies, it releases more of the virus. When this happens, you develop symptoms of the illness, and the virus continues to spread to other cells. If the virus takes over the function of the cell, it can cause the cell to divide and grow out of control, as is the case when a virus causes cancer. Different viruses get into the body in different ways. You can get a virus by:  Swallowing food or water that is contaminated with the virus.  Breathing in droplets that have been coughed or sneezed into the air by an infected person.  Touching a surface that has been contaminated with the virus and then touching your eyes, nose, or mouth.  Being bitten by an insect or animal that carries the virus.  Having sexual contact with a person who is infected with the virus.  Being exposed to blood or fluids that contain the virus, either through an open cut or during a transfusion. If a virus enters your body, your body's defense system (immune system) will try to fight the virus. You may be at higher risk for a viral illness if your immune system is weak. What are the signs or symptoms? Symptoms vary depending on the type of virus and the location of the cells that it  invades. Common symptoms of the main types of viral illnesses include: Cold and flu viruses  Fever.  Headache.  Sore throat.  Muscle aches.  Nasal congestion.  Cough. Digestive system (gastrointestinal) viruses  Fever.  Abdominal pain.  Nausea.  Diarrhea. Liver viruses (hepatitis)  Loss of appetite.  Tiredness.  Yellowing of the skin (jaundice). Brain and spinal cord viruses  Fever.  Headache.  Stiff neck.  Nausea and vomiting.  Confusion or sleepiness. Skin viruses  Warts.  Itching.  Rash. Sexually transmitted viruses  Discharge.  Swelling.  Redness.  Rash. How is this treated? Viruses can be difficult to treat because they live within cells. Antibiotic medicines do not treat viruses because these drugs do not get inside cells. Treatment for a viral illness may include:  Resting and drinking plenty of fluids.  Medicines to relieve symptoms. These can include over-the-counter medicine for pain and fever, medicines for cough or congestion, and medicines to relieve diarrhea.  Antiviral medicines. These drugs are available only for certain types of viruses. They may help reduce flu symptoms if taken early. There are also many antiviral medicines for hepatitis and HIV/AIDS. Some viral illnesses can be prevented with vaccinations. A common example is the flu shot. Follow these instructions at home: Medicines   Take over-the-counter and prescription medicines only as told by your health care provider.  If you were prescribed an antiviral medicine, take it as told by your health care provider. Do not stop taking   the medicine even if you start to feel better.  Be aware of when antibiotics are needed and when they are not needed. Antibiotics do not treat viruses. If your health care provider thinks that you may have a bacterial infection as well as a viral infection, you may get an antibiotic. ? Do not ask for an antibiotic prescription if you have  been diagnosed with a viral illness. That will not make your illness go away faster. ? Frequently taking antibiotics when they are not needed can lead to antibiotic resistance. When this develops, the medicine no longer works against the bacteria that it normally fights. General instructions  Drink enough fluids to keep your urine clear or pale yellow.  Rest as much as possible.  Return to your normal activities as told by your health care provider. Ask your health care provider what activities are safe for you.  Keep all follow-up visits as told by your health care provider. This is important. How is this prevented? Take these actions to reduce your risk of viral infection:  Eat a healthy diet and get enough rest.  Wash your hands often with soap and water. This is especially important when you are in public places. If soap and water are not available, use hand sanitizer.  Avoid close contact with friends and family who have a viral illness.  If you travel to areas where viral gastrointestinal infection is common, avoid drinking water or eating raw food.  Keep your immunizations up to date. Get a flu shot every year as told by your health care provider.  Do not share toothbrushes, nail clippers, razors, or needles with other people.  Always practice safe sex.  Contact a health care provider if:  You have symptoms of a viral illness that do not go away.  Your symptoms come back after going away.  Your symptoms get worse. Get help right away if:  You have trouble breathing.  You have a severe headache or a stiff neck.  You have severe vomiting or abdominal pain. This information is not intended to replace advice given to you by your health care provider. Make sure you discuss any questions you have with your health care provider. Document Released: 03/30/2016 Document Revised: 05/02/2016 Document Reviewed: 03/30/2016 Elsevier Interactive Patient Education  2019 Elsevier  Inc.   

## 2019-01-08 NOTE — Progress Notes (Signed)
Subjective   CC:  Chief Complaint  Patient presents with  . Flu like symptoms    Has been sick on and off for a month.. She has tried Mucinex and IBU    HPI: Jaime Hampton is a 38 y.o. female who presents to the office today to address the problems listed above in the chief complaint.  Patient complains of flu like symptoms including myalgias, low grade fever, ST, mild cough and some congestion. Sxs have been present for 24 hours. She has tried to alleviate the sxs with over-the-counter medicines with mild relief. No high risk factors for influenza complications or high risk household contacts are present. No SOB or CP are present. Taking in fluids adequately; decreased appetite bt no significant n/v/d. She has had the flu vaccine this season. 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 She has been suffering from mild URI sxs over the last 3-4 weeks but this is acute and worse. Denies sinus pain, productive cough or sob. + decreased appetite and loose stools.   Assessment  1. Viral syndrome   2. Flu-like symptoms   3. Sore throat      Plan   Reassured. Treat supportively. Cough syrup prescribed if needed. F/u if sob, persistent fever or sinus pain. See avs.   Follow up: Return if symptoms worsen or fail to improve.   Orders Placed This Encounter  Procedures  . POCT Influenza A/B  . POCT rapid strep A   Meds ordered this encounter  Medications  . guaiFENesin-codeine 100-10 MG/5ML syrup    Sig: Take 5 mLs by mouth every 6 (six) hours as needed for cough.    Dispense:  120 mL    Refill:  0     I reviewed the patients updated PMH, FH, and SocHx.    Patient Active Problem List   Diagnosis Date Noted  . Oral contraceptive use 05/27/2018    Priority: Medium  . GAD (generalized anxiety disorder) 05/27/2018    Priority: Medium  . Laryngopharyngeal reflux (LPR) 04/26/2016    Priority: Medium  . Persistent insomnia 07/13/2013   Priority: Medium  . Rosacea 05/27/2018    Priority: Low  . TMJ pain dysfunction syndrome 04/26/2016    Priority: Low   Current Meds  Medication Sig  . hydrocortisone cream 1 % Apply 1 application topically daily as needed. For itching  . ibuprofen (ADVIL,MOTRIN) 600 MG tablet Take 1 tablet (600 mg total) by mouth every 6 (six) hours.  . metroNIDAZOLE-Cleanser 0.75 % (Cream) KIT Apply topically.  . SPRINTEC 28 0.25-35 MG-MCG tablet   . [DISCONTINUED] pantoprazole (PROTONIX) 40 MG tablet TAKE 1 TABLET BY MOUTH EVERY DAY   Family History: Patient family history includes Diabetes in her maternal grandfather and paternal grandfather; Heart attack in her father and maternal grandfather; Heart disease in her maternal grandfather and maternal grandmother; Hyperlipidemia in her father; Hypertension in her father; Kidney Stones in her father; Migraines in her mother; Pulmonary embolism in her mother; Thrombocytopenia in her mother; Varicose Veins in her mother. Social History:  Patient  reports that she has never smoked. She has never used smokeless tobacco. She reports that she does not drink alcohol or use drugs.  Review of Systems: Constitutional: negative for fever or malaise Ophthalmic: negative for photophobia, double vision or loss of vision Cardiovascular: negative for chest pain, dyspnea on exertion, or new LE swelling Respiratory: negative for SOB or persistent cough Gastrointestinal: negative for abdominal  pain, change in bowel habits or melena Genitourinary: negative for dysuria or gross hematuria Musculoskeletal: negative for new gait disturbance or muscular weakness Integumentary: negative for new or persistent rashes Neurological: negative for TIA or stroke symptoms Psychiatric: negative for SI or delusions Allergic/Immunologic: negative for hives  Objective  Vitals: BP 122/76   Pulse (!) 106   Temp 99.8 F (37.7 C) (Oral)   Resp 16   Ht 5' 5" (1.651 m)   Wt 129 lb 9.6 oz  (58.8 kg)   LMP 12/31/2018   SpO2 98%   BMI 21.57 kg/m  General: no acute respiratory distress  Psych:  Alert and oriented, normal mood and affect HEENT: Normocephalic, nasal congestion present, TMs w/o erythema, OP with erythema w/o exudate, no LAD, supple neck  Cardiovascular:  RRR without murmur or gallop. no peripheral edema Respiratory:  Good breath sounds bilaterally, CTAB with normal respiratory effort Gastrointestinal: soft, flat abdomen, normal active bowel sounds, no palpable masses, no hepatosplenomegaly, no appreciated hernias Skin:  Warm, no rashes Neurologic:   Mental status is normal. normal gait  Office Visit on 01/08/2019  Component Date Value Ref Range Status  . Influenza A, POC 01/08/2019 Negative  Negative Final  . Influenza B, POC 01/08/2019 Negative  Negative Final  . Rapid Strep A Screen 01/08/2019 Negative  Negative Final     Commons side effects, risks, benefits, and alternatives for medications and treatment plan prescribed today were discussed, and the patient expressed understanding of the given instructions. Patient is instructed to call or message via MyChart if he/she has any questions or concerns regarding our treatment plan. No barriers to understanding were identified. We discussed Red Flag symptoms and signs in detail. Patient expressed understanding regarding what to do in case of urgent or emergency type symptoms.   Medication list was reconciled, printed and provided to the patient in AVS. Patient instructions and summary information was reviewed with the patient as documented in the AVS. This note was prepared with assistance of Dragon voice recognition software. Occasional wrong-word or sound-a-like substitutions may have occurred due to the inherent limitations of voice recognition software

## 2019-01-13 ENCOUNTER — Ambulatory Visit: Payer: Self-pay | Admitting: *Deleted

## 2019-01-13 MED ORDER — OSELTAMIVIR PHOSPHATE 75 MG PO CAPS: 75.0000 mg | ORAL_CAPSULE | Freq: Two times a day (BID) | ORAL | 0 refills | Status: DC

## 2019-01-13 NOTE — Telephone Encounter (Signed)
PEC may give information if pt calls back, unable to reach her. Number is not in service/changed

## 2019-01-13 NOTE — Telephone Encounter (Signed)
Have ordered tamiflu for her to start. Hopefully that will be helpful.  Ensure not having SOB or pain in chest.

## 2019-01-13 NOTE — Telephone Encounter (Signed)
Summary: flu symptoms not getting better   Pt states she was seen on 01/08/2019 and she is still having symptoms of the flu and wanting to see if something else can be ordered? She states her baby has been dx with the flu as well. Pt unable to come in to an appt due to husband being out of town and she is caring for the baby.           Attempted to reach patient. CB number states no longer in service or changed. Only number listed for patient; [phone]

## 2019-01-13 NOTE — Telephone Encounter (Signed)
Seen 01/08/19. Please advise

## 2019-01-14 ENCOUNTER — Ambulatory Visit (INDEPENDENT_AMBULATORY_CARE_PROVIDER_SITE_OTHER): Payer: No Typology Code available for payment source | Admitting: Family Medicine

## 2019-01-14 ENCOUNTER — Encounter: Payer: Self-pay | Admitting: Family Medicine

## 2019-01-14 ENCOUNTER — Other Ambulatory Visit: Payer: Self-pay

## 2019-01-14 VITALS — BP 104/70 | HR 95 | Temp 98.0°F | Resp 16 | Ht 65.0 in | Wt 129.0 lb

## 2019-01-14 DIAGNOSIS — J111 Influenza due to unidentified influenza virus with other respiratory manifestations: Secondary | ICD-10-CM

## 2019-01-14 DIAGNOSIS — J0100 Acute maxillary sinusitis, unspecified: Secondary | ICD-10-CM | POA: Diagnosis not present

## 2019-01-14 MED ORDER — ONDANSETRON 4 MG PO TBDP: 4.0000 mg | ORAL_TABLET | Freq: Three times a day (TID) | ORAL | 0 refills | Status: DC | PRN

## 2019-01-14 MED ORDER — AMOXICILLIN-POT CLAVULANATE 875-125 MG PO TABS: 1.0000 | ORAL_TABLET | Freq: Two times a day (BID) | ORAL | 0 refills | Status: DC

## 2019-01-14 NOTE — Telephone Encounter (Signed)
Pt coming in today at 9:45a

## 2019-01-14 NOTE — Patient Instructions (Addendum)
Please return in June 2020 for your annual complete physical; please come fasting.  I hope you feel better soon. Take the antibiotics as directed. You may use the nausea medication as needed. Keep hydrated and get some rest.  You may use OTC zantac or omeprazole to help your stomach irritation from the advil.   If you have any questions or concerns, please don't hesitate to send me a message via MyChart or call the office at [phone]. Thank you for visiting with [place] today! It's our pleasure caring for you.

## 2019-01-14 NOTE — Progress Notes (Signed)
Subjective   CC:  Chief Complaint  Patient presents with  . Sinusitis    HPI: Jaime Hampton is a 38 y.o. female who presents to the office today to address the problems listed above in the chief complaint.  Jaime Hampton is here for short-term follow-up.  She was seen last week with flulike symptoms.  Her flu test was negative at that time and she did not have high fevers.  However, since, her youngest son was diagnosed with influenza.  Today, patient reports sinus congestion and pressure with thick drainage, mild nonproductive cough, ear pressure without pain, and mild malaise.  Symptoms have been present for several days.She also has had intermittent high fevers, nausea and loose stool, no shortness of breath. Shehas had sinus infections in the past and this feels similar.  Patient is a non-smoker.  No history of asthma or COPD.  She is frustrated, this is the 8 to 9-day of illness.  I reviewed the patients updated PMH, FH, and SocHx.    Patient Active Problem List   Diagnosis Date Noted  . Oral contraceptive use 05/27/2018    Priority: Medium  . GAD (generalized anxiety disorder) 05/27/2018    Priority: Medium  . Laryngopharyngeal reflux (LPR) 04/26/2016    Priority: Medium  . Persistent insomnia 07/13/2013    Priority: Medium  . Rosacea 05/27/2018    Priority: Low  . TMJ pain dysfunction syndrome 04/26/2016    Priority: Low   Current Meds  Medication Sig  . guaiFENesin-codeine 100-10 MG/5ML syrup Take 5 mLs by mouth every 6 (six) hours as needed for cough.  . hydrocortisone cream 1 % Apply 1 application topically daily as needed. For itching  . ibuprofen (ADVIL,MOTRIN) 600 MG tablet Take 1 tablet (600 mg total) by mouth every 6 (six) hours.  . metroNIDAZOLE-Cleanser 0.75 % (Cream) KIT Apply topically.  . SPRINTEC 28 0.25-35 MG-MCG tablet     Review of Systems: Cardiovascular: negative for chest pain Respiratory: negative for SOB or persistent cough Gastrointestinal:  negative for abdominal pain Genitourinary: negative for dysuria or gross hematuria  Objective  Vitals: BP 104/70   Pulse 95   Temp 98 F (36.7 C) (Oral)   Resp 16   Ht 5' 5" (1.651 m)   Wt 129 lb (58.5 kg)   LMP 12/31/2018   SpO2 99%   BMI 21.47 kg/m  General: no acute distress  Psych:  Alert and oriented, normal mood and affect HEENT:  Normocephalic, atraumatic, TMs with serous effusions or retraction w/o erythema, nasal mucosa is red with purulent drainage, tender maxillary sinus present, OP mild erythematous w/o eudate, supple neck with LAD Cardiovascular:  RRR without murmur or gallop. no peripheral edema Respiratory:  Good breath sounds bilaterally, CTAB with normal respiratory effort Skin:  Warm, no rashes Neurologic:   Mental status is normal. normal gait  Assessment  1. Acute non-recurrent maxillary sinusitis   2. Influenza      Plan    Likely influenza with secondary maxillary sinusitis: History and exam is most consistent with bacterial sinus infection.  Etiology and prognosis discussed with patient.  Recommend antibiotics as ordered below.  Patient to complete course of antibiotics, use supportive medications like mucolytics and decongestants as needed.  May use Tylenol or Advil if needed.  Symptoms should improve over the next 2 weeks.  Patient will return or call if symptoms persist or worsen.  Follow up: As needed   Commons side effects, risks, benefits, and alternatives for medications and  treatment plan prescribed today were discussed, and the patient expressed understanding of the given instructions. Patient is instructed to call or message via MyChart if he/she has any questions or concerns regarding our treatment plan. No barriers to understanding were identified. We discussed Red Flag symptoms and signs in detail. Patient expressed understanding regarding what to do in case of urgent or emergency type symptoms.   Medication list was reconciled, printed and  provided to the patient in AVS. Patient instructions and summary information was reviewed with the patient as documented in the AVS. This note was prepared with assistance of Dragon voice recognition software. Occasional wrong-word or sound-a-like substitutions may have occurred due to the inherent limitations of voice recognition software  No orders of the defined types were placed in this encounter.  Meds ordered this encounter  Medications  . amoxicillin-clavulanate (AUGMENTIN) 875-125 MG tablet    Sig: Take 1 tablet by mouth 2 (two) times daily.    Dispense:  14 tablet    Refill:  0  . ondansetron (ZOFRAN ODT) 4 MG disintegrating tablet    Sig: Take 1 tablet (4 mg total) by mouth every 8 (eight) hours as needed for nausea or vomiting.    Dispense:  10 tablet    Refill:  0         

## 2019-01-16 ENCOUNTER — Encounter: Payer: Self-pay | Admitting: Family Medicine

## 2019-01-28 ENCOUNTER — Telehealth: Payer: Self-pay | Admitting: Family Medicine

## 2019-01-28 NOTE — Telephone Encounter (Signed)
Order received.

## 2019-01-28 NOTE — Telephone Encounter (Signed)
Will verify with Dr. Mardelle Matte tomorrow

## 2019-01-28 NOTE — Telephone Encounter (Signed)
Copied from CRM [id number]. Topic: Referral - Request for Referral >> Jan 28, 2019  1:29 PM Burchel, Abbi R wrote: Per Sue Lush at [facility]: Pt needs a referral to [facility] for left breast pain. Sue Lush will fac notes and referral form.

## 2019-01-29 NOTE — Telephone Encounter (Signed)
Dr. Mardelle Matte out of office, have notes will speak with her when she returns

## 2019-02-06 ENCOUNTER — Other Ambulatory Visit: Payer: Self-pay | Admitting: *Deleted

## 2019-02-09 ENCOUNTER — Telehealth: Payer: Self-pay | Admitting: Family Medicine

## 2019-02-09 DIAGNOSIS — N644 Mastodynia: Secondary | ICD-10-CM

## 2019-02-09 NOTE — Telephone Encounter (Signed)
Ok to send referral  

## 2019-02-09 NOTE — Telephone Encounter (Signed)
Copied from CRM [id number]. Topic: Quick Communication - See Telephone Encounter >> Feb 09, 2019  1:06 PM Fanny Bien wrote: CRM for notification. See Telephone encounter for: 02/09/19. Pt called and stated that she would like an order put in for a mammogram. Pt states that she was sent by obgyn(Dr cousins) because she has a spot on her breast. Please advise

## 2019-02-09 NOTE — Telephone Encounter (Signed)
Dr. Cherly Hensen office had sent over OV notes that pt needed diagnostic mammogram of Left breast, due to insurance PCP has to send referral.  Please advise if this is ok to send.

## 2019-03-03 LAB — HM MAMMOGRAPHY

## 2019-03-05 ENCOUNTER — Encounter: Payer: Self-pay | Admitting: *Deleted

## 2019-09-02 ENCOUNTER — Other Ambulatory Visit: Payer: Self-pay

## 2019-11-03 ENCOUNTER — Ambulatory Visit (INDEPENDENT_AMBULATORY_CARE_PROVIDER_SITE_OTHER): Payer: No Typology Code available for payment source | Admitting: Family Medicine

## 2019-11-03 ENCOUNTER — Encounter: Payer: Self-pay | Admitting: Family Medicine

## 2019-11-03 VITALS — Ht 65.0 in | Wt 129.0 lb

## 2019-11-03 DIAGNOSIS — R109 Unspecified abdominal pain: Secondary | ICD-10-CM | POA: Diagnosis not present

## 2019-11-03 DIAGNOSIS — M545 Low back pain, unspecified: Secondary | ICD-10-CM

## 2019-11-03 DIAGNOSIS — U071 COVID-19: Secondary | ICD-10-CM | POA: Diagnosis not present

## 2019-11-03 MED ORDER — HYOSCYAMINE SULFATE SL 0.125 MG SL SUBL: 0.1250 mg | SUBLINGUAL_TABLET | Freq: Three times a day (TID) | SUBLINGUAL | 0 refills | Status: DC

## 2019-11-03 NOTE — Progress Notes (Signed)
Virtual Visit via Video Note  Subjective  CC:  Chief Complaint  Patient presents with  . Abdominal Pain    x 2 mos-c/o nausea, diarrhea, constipation on and off.  Stomach pain has worsened since being dx w/covid back in October.  Patient states that sx are worse at night  . Back Pain    mid-low back pain x 4 days     I connected with Ermel Verne Pavlovic on 11/03/19 at  2:20 PM EST by a video enabled telemedicine application and verified that I am speaking with the correct person using two identifiers. Location patient: Home Location provider: [facility], Office Persons participating in the virtual visit: BRADY PLANT, Leamon Arnt, MD Adolm Joseph, CMA  I discussed the limitations of evaluation and management by telemedicine and the availability of in person appointments. The patient expressed understanding and agreed to proceed. HPI: HALINA ASANO is a 38 y.o. female who was contacted today to address the problems listed above in the chief complaint. Marland Kitchen dxd with covid early October. Has multiple sxs lasting several weeks including abdominal pain with diarrhea: most sxs have completely resolved but having recurrent bowel issues: central abdominal pain, loose stools intermittently, mild nausea w/o vomiting. No blood or mucous in stool. No f/c/s or uri sxs. Has started a probiotic last week. Tried gas x w/o resolution of sxs. Some fatigue as well. Has remote h/o some mild IBS sxs resolved with probiotics. No h/o abx, recent travel, sick contacts. Appetite is fine. No epigastric pain or heartburn. Pain is not related to meals.  . Mid back started aching last week as well. ? Related to GI complaints or due to inactivity etc. No radicular sxs or B/B incontinence. No urinary sxs. No trauma or strain.  .  Assessment  1. Abdominal cramping   2. COVID-19   3. Acute bilateral low back pain without sciatica      Plan   Abdominal cramping with loose stools:  No  red flag sxs present. Suspect related to recent covid infection/bacterial overgrowth: rec probiotic, antispasmodic, bland diet and time. If persists or worsens, rec office visit for further evaluation.   Back pain: suspect msk. Doesn't sound like related to GI. Monitor. advil or tylenol.   I discussed the assessment and treatment plan with the patient. The patient was provided an opportunity to ask questions and all were answered. The patient agreed with the plan and demonstrated an understanding of the instructions.   The patient was advised to call back or seek an in-person evaluation if the symptoms worsen or if the condition fails to improve as anticipated. Follow up: Return for complete physical.  Visit date not found  Meds ordered this encounter  Medications  . Hyoscyamine Sulfate SL (LEVSIN/SL) 0.125 MG SUBL    Sig: Place 0.125 mg under the tongue 3 (three) times daily before meals. As needed for bloating and cramping    Dispense:  60 tablet    Refill:  0      I reviewed the patients updated PMH, FH, and SocHx.    Patient Active Problem List   Diagnosis Date Noted  . Oral contraceptive use 05/27/2018    Priority: Medium  . GAD (generalized anxiety disorder) 05/27/2018    Priority: Medium  . Laryngopharyngeal reflux (LPR) 04/26/2016    Priority: Medium  . Persistent insomnia 07/13/2013    Priority: Medium  . Rosacea 05/27/2018    Priority: Low  .  TMJ pain dysfunction syndrome 04/26/2016    Priority: Low   Current Meds  Medication Sig  . hydrocortisone cream 1 % Apply 1 application topically daily as needed. For itching  . metroNIDAZOLE-Cleanser 0.75 % (Cream) KIT Apply topically.  . norethindrone-ethinyl estradiol (LOESTRIN FE) 1-20 MG-MCG tablet Take 1 tablet by mouth daily.  . pantoprazole (PROTONIX) 40 MG tablet Take 40 mg by mouth daily. Started taking on 11/01/19  . Probiotic Product (PROBIOTIC PO) Take by mouth.  . [DISCONTINUED] SPRINTEC 28 0.25-35 MG-MCG  tablet     Allergies: Patient has No Known Allergies. Family History: Patient family history includes Diabetes in her maternal grandfather and paternal grandfather; Heart attack in her father and maternal grandfather; Heart disease in her maternal grandfather and maternal grandmother; Hyperlipidemia in her father; Hypertension in her father; Kidney Stones in her father; Migraines in her mother; Pulmonary embolism in her mother; Thrombocytopenia in her mother; Varicose Veins in her mother. Social History:  Patient  reports that she has never smoked. She has never used smokeless tobacco. She reports that she does not drink alcohol or use drugs.  Review of Systems: Constitutional: Negative for fever malaise or anorexia Cardiovascular: negative for chest pain Respiratory: negative for SOB or persistent cough Gastrointestinal: + for abdominal pain  OBJECTIVE Vitals: Ht 5' 5" (1.651 m)   Wt 129 lb (58.5 kg)   LMP  (LMP Unknown)   BMI 21.47 kg/m  General: no acute distress , A&Ox3  Leamon Arnt, MD

## 2019-11-15 ENCOUNTER — Other Ambulatory Visit: Payer: Self-pay | Admitting: Family Medicine

## 2020-03-24 ENCOUNTER — Telehealth (INDEPENDENT_AMBULATORY_CARE_PROVIDER_SITE_OTHER): Payer: No Typology Code available for payment source | Admitting: Family Medicine

## 2020-03-24 ENCOUNTER — Encounter: Payer: Self-pay | Admitting: Family Medicine

## 2020-03-24 VITALS — Ht 65.0 in | Wt 133.0 lb

## 2020-03-24 DIAGNOSIS — R21 Rash and other nonspecific skin eruption: Secondary | ICD-10-CM

## 2020-03-24 DIAGNOSIS — W57XXXA Bitten or stung by nonvenomous insect and other nonvenomous arthropods, initial encounter: Secondary | ICD-10-CM

## 2020-03-24 MED ORDER — DOXYCYCLINE HYCLATE 100 MG PO TABS: 200.0000 mg | ORAL_TABLET | Freq: Once | ORAL | 0 refills | Status: AC

## 2020-03-24 NOTE — Progress Notes (Signed)
   Jaime Hampton is a 39 y.o. female who presents today for a virtual office visit.  Assessment/Plan:  New/Acute Problems: Rash Likely local inflammatory reaction.  Low likelihood for Lyme disease however given 72-hour window and unknown attachment time, it is reasonable to give single prophylactic dose of doxycycline.  We will send in for patient.  Discussed warning signs and reasons to return to care.     Subjective:  HPI:  Patient here for a virtual visit with low back rash.  Started 3 days ago.  She pulled a tick from the area about 3 days ago and noticed a rash over the last couple of days.  Area is itchy and warm to the touch.  No pain.  No purulent drainage.  No reported fever chills.  She was in [place] when she thinks it was attached.       Objective/Observations  Physical Exam: Gen: NAD, resting comfortably Pulm: Normal work of breathing Neuro: Grossly normal, moves all extremities Psych: Normal affect and thought content Skin: 1 cm erythematous rash on lower mid back.  Virtual Visit via Video   I connected with Jaime Hampton on 03/24/20 at  3:40 PM EDT by a video enabled telemedicine application and verified that I am speaking with the correct person using two identifiers. The limitations of evaluation and management by telemedicine and the availability of in person appointments were discussed. The patient expressed understanding and agreed to proceed.   Patient location: Home Provider location: [place] [employer] Persons participating in the virtual visit: Myself and Patient     Katina Degree. Jimmey Ralph, MD 03/24/2020 3:38 PM

## 2020-04-01 ENCOUNTER — Ambulatory Visit: Payer: No Typology Code available for payment source | Admitting: Physician Assistant

## 2020-04-04 ENCOUNTER — Ambulatory Visit: Payer: No Typology Code available for payment source | Admitting: Family Medicine

## 2020-04-04 DIAGNOSIS — Z0289 Encounter for other administrative examinations: Secondary | ICD-10-CM

## 2020-05-04 ENCOUNTER — Telehealth: Payer: Self-pay | Admitting: Family Medicine

## 2020-05-04 NOTE — Telephone Encounter (Signed)
Jaime Hampton, Can you take a look and let me know what you think. You saw her at the end of April for a tick bite. Thanks!

## 2020-05-04 NOTE — Telephone Encounter (Signed)
Patient is calling in wanting to follow up on her tick bite, states that she would like to be tested for lyme disease but next available is not until 05/24/20.

## 2020-05-04 NOTE — Telephone Encounter (Signed)
Patient also states she has been having some neck pain and doesn't know if it could be related

## 2020-05-05 ENCOUNTER — Other Ambulatory Visit: Payer: Self-pay

## 2020-05-05 DIAGNOSIS — M542 Cervicalgia: Secondary | ICD-10-CM

## 2020-05-05 NOTE — Telephone Encounter (Signed)
She has a pretty low likelihood of lyme disease especially since we gave prophylaxis, but I am ok with ordering the lab under my name if it gives her peace of mind.  Katina Degree. Jimmey Ralph, MD 05/05/2020 8:42 AM

## 2020-05-05 NOTE — Telephone Encounter (Signed)
Please refer as requested and thank you.

## 2020-05-05 NOTE — Telephone Encounter (Signed)
Referral placed.

## 2020-05-05 NOTE — Telephone Encounter (Signed)
Patient is aware that labs have been ordered. Also requesting chiropractor referral for Jaime Hampton with [facility] of [place]. OK for referral?

## 2020-05-05 NOTE — Telephone Encounter (Signed)
Please schedule lab appt: please let her know she has an extremely low likliehood of lymes' disease given exposure history and taking doxycycline and let her know that lyme's disease lab work is not straightforward to interpret.  But we can check her levels. I've ordered the labs.  Thanks!

## 2020-05-09 ENCOUNTER — Other Ambulatory Visit: Payer: No Typology Code available for payment source

## 2020-05-09 ENCOUNTER — Encounter: Payer: Self-pay | Admitting: Family Medicine

## 2020-05-09 ENCOUNTER — Other Ambulatory Visit: Payer: Self-pay

## 2020-05-09 DIAGNOSIS — W57XXXA Bitten or stung by nonvenomous insect and other nonvenomous arthropods, initial encounter: Secondary | ICD-10-CM

## 2020-05-12 LAB — LYME AB/WESTERN BLOT REFLEX
LYME DISEASE AB, QUANT, IGM: <0.8 index (ref 0.00–0.79)
Lyme IgG/IgM Ab: <0.91 {ISR} (ref 0.00–0.90)

## 2020-05-19 ENCOUNTER — Telehealth (INDEPENDENT_AMBULATORY_CARE_PROVIDER_SITE_OTHER): Payer: No Typology Code available for payment source | Admitting: Family Medicine

## 2020-05-19 DIAGNOSIS — R300 Dysuria: Secondary | ICD-10-CM

## 2020-05-19 MED ORDER — NITROFURANTOIN MONOHYD MACRO 100 MG PO CAPS: 100.0000 mg | ORAL_CAPSULE | Freq: Two times a day (BID) | ORAL | 0 refills | Status: DC

## 2020-05-19 NOTE — Patient Instructions (Signed)
-  I sent the medication(s) we discussed to your pharmacy: °Meds ordered this encounter  °Medications  °• nitrofurantoin, macrocrystal-monohydrate, (MACROBID) 100 MG capsule  °  Sig: Take 1 capsule (100 mg total) by mouth 2 (two) times daily.  °  Dispense:  14 capsule  °  Refill:  0  ° ° °Please let us know if you have any questions or concerns regarding this prescription. ° °I hope you are feeling better soon! °Seek care promptly if your symptoms worsen, new concerns arise or you are not improving with treatment. ° °

## 2020-05-19 NOTE — Progress Notes (Signed)
Virtual Visit via Video Note  I connected with Jaime Hampton  on 05/19/20 at 12:00 PM EDT by a video enabled telemedicine application and verified that I am speaking with the correct person using two identifiers.  Location patient: home Location provider:work or home office Persons participating in the virtual visit: patient, provider  I discussed the limitations of evaluation and management by telemedicine and the availability of in person appointments. The patient expressed understanding and agreed to proceed.   HPI:  Acute visit for dysuria: -started about 3-4 days ago -symptoms include urinary urgency, dysuria, frequency -denies fevers, NV, some abd pain bilat yesterday that she reports could have been gas, vaginal discharge, hematuria -azo helped her symptoms -FDLMP: due in a few days, denies concern for pregnancy -has had UTIs in the past and this feels similar  -PMH of nephrolithiasis, but reports this feels different than that - had sharp pain with that   ROS: See pertinent positives and negatives per HPI.  Past Medical History:  Diagnosis Date  . Abnormal Pap smear   . Allergy   . Anxiety   . Rosacea 05/27/2018    Past Surgical History:  Procedure Laterality Date  . DILATION AND CURETTAGE OF UTERUS      Family History  Problem Relation Age of Onset  . Varicose Veins Mother   . Thrombocytopenia Mother   . Pulmonary embolism Mother   . Migraines Mother   . Kidney Stones Father   . Hypertension Father   . Heart attack Father   . Hyperlipidemia Father   . Heart disease Maternal Grandmother   . Diabetes Maternal Grandfather   . Heart attack Maternal Grandfather   . Heart disease Maternal Grandfather   . Diabetes Paternal Grandfather   . Anesthesia problems Neg Hx     SOCIAL HX: see hpi   Current Outpatient Medications:  .  fluticasone (FLONASE) 50 MCG/ACT nasal spray, Place 1 spray into both nostrils daily as needed for allergies. , Disp: , Rfl:  .   hyoscyamine (LEVSIN SL) 0.125 MG SL tablet, PLACE 1 TABLET UNDER THE TONGUE 3 TIMES DAILY BEFORE MEALS AS NEEDED FOR BLOATING AND CRAMPING (Patient not taking: Reported on 03/24/2020), Disp: 60 tablet, Rfl: 0 .  metroNIDAZOLE-Cleanser 0.75 % (Cream) KIT, Apply topically., Disp: , Rfl:  .  norethindrone-ethinyl estradiol (LOESTRIN FE) 1-20 MG-MCG tablet, Take 1 tablet by mouth daily., Disp: , Rfl:  .  pantoprazole (PROTONIX) 40 MG tablet, Take 40 mg by mouth daily. Started taking on 11/01/19, Disp: , Rfl:  .  Probiotic Product (PROBIOTIC PO), Take by mouth., Disp: , Rfl:   EXAM:  VITALS per patient if applicable:  GENERAL: alert, oriented, appears well and in no acute distress  HEENT: atraumatic, conjunttiva clear, no obvious abnormalities on inspection of external nose and ears  NECK: normal movements of the [place] and neck  LUNGS: on inspection no signs of respiratory distress, breathing rate appears normal, no obvious gross SOB, gasping or wheezing  CV: no obvious cyanosis  MS: moves all visible extremities without noticeable abnormality  PSYCH/NEURO: pleasant and cooperative, no obvious depression or anxiety, speech and thought processing grossly intact  ASSESSMENT AND PLAN:  Discussed the following assessment and plan:  No diagnosis found.  -we discussed possible serious and likely etiologies, options for evaluation and workup, limitations of telemedicine visit vs in person visit, treatment, treatment risks and precautions. Pt prefers to treat via telemedicine empirically for a possible UTI rather than pursuing additional work up at this  time. Sent Rx for Macrobid 157m bid.  Patient agrees to seek prompt in person care if worsening, new symptoms arise, or if is not improving with treatment.   I discussed the assessment and treatment plan with the patient. The patient was provided an opportunity to ask questions and all were answered. The patient agreed with the plan and demonstrated  an understanding of the instructions.   The patient was advised to call back or seek an in-person evaluation if the symptoms worsen or if the condition fails to improve as anticipated.   Jaime Kern DO

## 2021-06-28 ENCOUNTER — Ambulatory Visit (INDEPENDENT_AMBULATORY_CARE_PROVIDER_SITE_OTHER): Payer: No Typology Code available for payment source | Admitting: Family Medicine

## 2021-06-28 ENCOUNTER — Other Ambulatory Visit: Payer: Self-pay

## 2021-06-28 ENCOUNTER — Encounter: Payer: Self-pay | Admitting: Family Medicine

## 2021-06-28 VITALS — BP 107/72 | HR 81 | Temp 98.2°F | Wt 129.0 lb

## 2021-06-28 DIAGNOSIS — N920 Excessive and frequent menstruation with regular cycle: Secondary | ICD-10-CM

## 2021-06-28 DIAGNOSIS — K219 Gastro-esophageal reflux disease without esophagitis: Secondary | ICD-10-CM

## 2021-06-28 DIAGNOSIS — R197 Diarrhea, unspecified: Secondary | ICD-10-CM

## 2021-06-28 DIAGNOSIS — K589 Irritable bowel syndrome without diarrhea: Secondary | ICD-10-CM | POA: Diagnosis not present

## 2021-06-28 LAB — COMPREHENSIVE METABOLIC PANEL
ALT: 23 U/L (ref 0–35)
AST: 23 U/L (ref 0–37)
Albumin: 4.3 g/dL (ref 3.5–5.2)
Alkaline Phosphatase: 31 U/L — ABNORMAL LOW (ref 39–117)
BUN: 11 mg/dL (ref 6–23)
CO2: 26 mEq/L (ref 19–32)
Calcium: 9.2 mg/dL (ref 8.4–10.5)
Chloride: 104 mEq/L (ref 96–112)
Creatinine, Ser: 0.67 mg/dL (ref 0.40–1.20)
GFR: 108.26 mL/min (ref >60.00)
Glucose, Bld: 115 mg/dL — ABNORMAL HIGH (ref 70–99)
Potassium: 3.9 mEq/L (ref 3.5–5.1)
Sodium: 139 mEq/L (ref 135–145)
Total Bilirubin: 0.6 mg/dL (ref 0.2–1.2)
Total Protein: 6.8 g/dL (ref 6.0–8.3)

## 2021-06-28 LAB — POCT URINE PREGNANCY: Preg Test, Ur: NEGATIVE

## 2021-06-28 LAB — CBC WITH DIFFERENTIAL/PLATELET
Basophils Absolute: 0 10*3/uL (ref 0.0–0.1)
Basophils Relative: 0.6 % (ref 0.0–3.0)
Eosinophils Absolute: 0.2 10*3/uL (ref 0.0–0.7)
Eosinophils Relative: 2.6 % (ref 0.0–5.0)
HCT: 39.1 % (ref 36.0–46.0)
Hemoglobin: 13.3 g/dL (ref 12.0–15.0)
Lymphocytes Relative: 29.7 % (ref 12.0–46.0)
Lymphs Abs: 2.2 10*3/uL (ref 0.7–4.0)
MCHC: 33.9 g/dL (ref 30.0–36.0)
MCV: 91.8 fl (ref 78.0–100.0)
Monocytes Absolute: 0.7 10*3/uL (ref 0.1–1.0)
Monocytes Relative: 9.8 % (ref 3.0–12.0)
Neutro Abs: 4.3 10*3/uL (ref 1.4–7.7)
Neutrophils Relative %: 57.3 % (ref 43.0–77.0)
Platelets: 356 10*3/uL (ref 150.0–400.0)
RBC: 4.26 Mil/uL (ref 3.87–5.11)
RDW: 13.7 % (ref 11.5–15.5)
WBC: 7.5 10*3/uL (ref 4.0–10.5)

## 2021-06-28 LAB — TSH: TSH: 1.24 u[IU]/mL (ref 0.35–5.50)

## 2021-06-28 LAB — SEDIMENTATION RATE: Sed Rate: 7 mm/hr (ref 0–20)

## 2021-06-28 MED ORDER — HYOSCYAMINE SULFATE 0.125 MG SL SUBL: 0.1250 mg | SUBLINGUAL_TABLET | Freq: Three times a day (TID) | SUBLINGUAL | 0 refills | Status: DC | PRN

## 2021-06-28 MED ORDER — PANTOPRAZOLE SODIUM 40 MG PO TBEC: 40.0000 mg | DELAYED_RELEASE_TABLET | Freq: Every day | ORAL | 2 refills | Status: DC

## 2021-06-28 NOTE — Progress Notes (Signed)
Subjective  CC:  Chief Complaint  Patient presents with   Diarrhea    All symptoms started a month ago, diarrhea, abdominal cramping/pain. Still does not feel "normal"     HPI: Jaime Hampton is a 40 y.o. female who presents to the office today to address the problems listed above in the chief complaint. 40 year old overall healthy female presents due to persistent diarrhea.  She was last seen physically in the office in 2019.  I reviewed labs from then.  I reviewed office notes from them.  She complains of a 3 to 5-day GI illness with watery diarrhea, myalgias, fatigue and nausea.  No vomiting or fever at that time.  Symptoms have actually improved, however her bowel movements have returned to normal.  She continues to have loose stools about once per day.  Worse with lactose/dairy, possibly worsened with stressors.  No blood or mucus in the stool.  No further myalgias but she does remain more fatigued than usual.  Stress is mildly increased due to her work transition but she is coping well.  She sleeps well.  Has bloating abdominal cramping pain.  No localized symptoms.  No family history of inflammatory bowel disease.  In years past, she was treated for IBS, but mainly constipation predominant.  She has used Phazyme to help with the gas problems and occasional Imodium.  Both are helpful but symptoms recur.  Her appetite is fine.  No weight changes.  No palpitations or lower extremity edema.  No heat intolerance. Off birth control for 6 months.  Her husband did have a vasectomy.  Her menstrual cycle may be a few days late.  She complains of very heavy menstrual cycles that come regularly. History of LPR and mild reflux: She does complain of a pit in the center of her stomach feeling.  No obvious heartburn.  No hematemesis.  No further nausea.  Assessment  1. Diarrhea, unspecified type   2. Irritable bowel syndrome, unspecified type   3. Laryngopharyngeal reflux (LPR)   4. Menorrhagia with  regular cycle      Plan  Diarrhea: Gastroenteritis, resolved with possibly postinfectious IBS flare.  Could also be microscopic colitis or other forms of colitis.  She is nontoxic and has a benign abdomen now.  We will check lab work, treat with Levsin and Protonix, as needed Imodium, FODMAP diet and further evaluation if symptoms do not resolve.  Education given. LPR/GERD: Restart Protonix Menorrhagia: Rule out pregnancy with urine pregnancy test today.  Check for anemia.  Patient will consult her gynecologist for management of her menstrual cycles.  Follow up: 3 to 6 months for complete physical, sooner if GI symptoms persist. Visit date not found  Orders Placed This Encounter  Procedures   GI pathogen panel by PCR, stool   CBC with Differential/Platelet   Comprehensive metabolic panel   Sedimentation rate   TSH   Iron, TIBC and Ferritin Panel   Gastrointestinal Pathogen Panel PCR   POCT urine pregnancy    Meds ordered this encounter  Medications   hyoscyamine (LEVSIN SL) 0.125 MG SL tablet    Sig: Take 1 tablet (0.125 mg total) by mouth 3 (three) times daily as needed.    Dispense:  60 tablet    Refill:  0   pantoprazole (PROTONIX) 40 MG tablet    Sig: Take 1 tablet (40 mg total) by mouth daily. Started taking on 11/01/19    Dispense:  30 tablet    Refill:  2  I reviewed the patients updated PMH, FH, and SocHx.    Patient Active Problem List   Diagnosis Date Noted   Oral contraceptive use 05/27/2018    Priority: Medium   GAD (generalized anxiety disorder) 05/27/2018    Priority: Medium   Laryngopharyngeal reflux (LPR) 04/26/2016    Priority: Medium   Persistent insomnia 07/13/2013    Priority: Medium   Rosacea 05/27/2018    Priority: Low   TMJ pain dysfunction syndrome 04/26/2016    Priority: Low   No outpatient medications have been marked as taking for the 06/28/21 encounter (Office Visit) with Willow Ora, MD.    Allergies: Patient has No Known  Allergies. Family History: Patient family history includes Diabetes in her maternal grandfather and paternal grandfather; Heart attack in her father and maternal grandfather; Heart disease in her maternal grandfather and maternal grandmother; Hyperlipidemia in her father; Hypertension in her father; Kidney Stones in her father; Migraines in her mother; Pulmonary embolism in her mother; Thrombocytopenia in her mother; Varicose Veins in her mother. Social History:  Patient  reports that she has never smoked. She has never used smokeless tobacco. She reports that she does not drink alcohol and does not use drugs.  Review of Systems: Constitutional: Negative for fever malaise or anorexia Cardiovascular: negative for chest pain Respiratory: negative for SOB or persistent cough Gastrointestinal: + for abdominal pain  Objective  Vitals: BP 107/72   Pulse 81   Temp 98.2 F (36.8 C) (Temporal)   Wt 129 lb (58.5 kg)   SpO2 96%   BMI 21.47 kg/m  General: no acute distress , A&Ox3, appears well HEENT: PEERL, conjunctiva normal, neck is supple Cardiovascular:  RRR without murmur or gallop.  Respiratory:  Good breath sounds bilaterally, CTAB with normal respiratory effort Gastrointestinal: soft, flat abdomen, normal active bowel sounds, no palpable masses, no hepatosplenomegaly, no appreciated hernias Mild tenderness in bilateral upper quadrants without rebound or guarding. Skin:  Warm, no rashes  Commons side effects, risks, benefits, and alternatives for medications and treatment plan prescribed today were discussed, and the patient expressed understanding of the given instructions. Patient is instructed to call or message via MyChart if he/she has any questions or concerns regarding our treatment plan. No barriers to understanding were identified. We discussed Red Flag symptoms and signs in detail. Patient expressed understanding regarding what to do in case of urgent or emergency type symptoms.   Medication list was reconciled, printed and provided to the patient in AVS. Patient instructions and summary information was reviewed with the patient as documented in the AVS. This note was prepared with assistance of Dragon voice recognition software. Occasional wrong-word or sound-a-like substitutions may have occurred due to the inherent limitations of voice recognition software  This visit occurred during the SARS-CoV-2 public health emergency.  Safety protocols were in place, including screening questions prior to the visit, additional usage of staff PPE, and extensive cleaning of exam room while observing appropriate contact time as indicated for disinfecting solutions.

## 2021-06-28 NOTE — Patient Instructions (Signed)
Please return in 3-6 months for your annual complete physical; please come fasting.  Sooner if your abdominal symptoms do not improve.   I will release your lab results to you on your MyChart account with further instructions. Please reply with any questions.    If you have any questions or concerns, please don't hesitate to send me a message via MyChart or call the office at [phone]. Thank you for visiting with [place] today! It's our pleasure caring for you.   Low-FODMAP Eating Plan  FODMAP stands for fermentable oligosaccharides, disaccharides, monosaccharides, and polyols. These are sugars that are hard for some people to digest. A low-FODMAP eating plan may help some people who have irritable bowel syndrome (IBS) and certain other bowel (intestinal) diseases to manage their symptoms. This meal plan can be complicated to follow. Work with a diet and nutrition specialist (dietitian) to make a low-FODMAP eating plan that is right for you. A dietitian can helpmake sure that you get enough nutrition from this diet. What are tips for following this plan? Reading food labels Check labels for hidden FODMAPs such as: High-fructose syrup. Honey. Agave. Natural fruit flavors. Onion or garlic powder. Choose low-FODMAP foods that contain 3-4 grams of fiber per serving. Check food labels for serving sizes. Eat only one serving at a time to make sure FODMAP levels stay low. Shopping Shop with a list of foods that are recommended on this diet and make a meal plan. Meal planning Follow a low-FODMAP eating plan for up to 6 weeks, or as told by your health care provider or dietitian. To follow the eating plan: Eliminate high-FODMAP foods from your diet completely. Choose only low-FODMAP foods to eat. You will do this for 2-6 weeks. Gradually reintroduce high-FODMAP foods into your diet one at a time. Most people should wait a few days before introducing the next new high-FODMAP food into their meal plan.  Your dietitian can recommend how quickly you may reintroduce foods. Keep a daily record of what and how much you eat and drink. Make note of any symptoms that you have after eating. Review your daily record with a dietitian regularly to identify which foods you can eat and which foods you should avoid. General tips Drink enough fluid each day to keep your urine pale yellow. Avoid processed foods. These often have added sugar and may be high in FODMAPs. Avoid most dairy products, whole grains, and sweeteners. Work with a dietitian to make sure you get enough fiber in your diet. Avoid high FODMAP foods at meals to manage symptoms. Recommended foods Fruits Bananas, oranges, tangerines, lemons, limes, blueberries, raspberries, strawberries, grapes, cantaloupe, honeydew melon, kiwi, papaya, passion fruit, and pineapple. Limited amounts of dried cranberries, banana chips, and shreddedcoconut. Vegetables Eggplant, zucchini, cucumber, peppers, green beans, bean sprouts, lettuce, arugula, kale, Swiss chard, spinach, collard greens, bok choy, summer squash, potato, and tomato. Limited amounts of corn, carrot, and sweet potato. Greenparts of scallions. Grains Gluten-free grains, such as rice, oats, buckwheat, quinoa, corn, polenta, andmillet. Gluten-free pasta, bread, or cereal. Rice noodles. Corn tortillas. Meats and other proteins Unseasoned beef, pork, poultry, or fish. Eggs. Tomasa Blase. Tofu (firm) and tempeh. Limited amounts of nuts and seeds, such as almonds, walnuts, [place] nuts,pecans, peanuts, nut butters, pumpkin seeds, chia seeds, and sunflower seeds. Dairy Lactose-free milk, yogurt, and kefir. Lactose-free cottage cheese and ice cream. Non-dairy milks, such as almond, coconut, hemp, and rice milk. Non-dairy yogurt. Limited amounts of goat cheese, brie, mozzarella, parmesan, swiss, andother hard cheeses. Fats  and oils Butter-free spreads. Vegetable oils, such as olive, canola, and sunflower  oil. Seasoning and other foods Artificial sweeteners with names that do not end in "ol," such as aspartame, saccharine, and stevia. Maple syrup, white table sugar, raw sugar, brown sugar, and molasses. Mayonnaise, soy sauce, and tamari. Fresh basil, coriander,parsley, rosemary, and thyme. Beverages Water and mineral water. Sugar-sweetened soft drinks. Small amounts of orangejuice or cranberry juice. Black and green tea. Most dry wines. Coffee. The items listed above may not be a complete list of foods and beverages you can eat. Contact a dietitian for more information. Foods to avoid Fruits Fresh, dried, and juiced forms of apple, pear, watermelon, peach, plum, cherries, apricots, blackberries, boysenberries, figs, nectarines, and mango.Avocado. Vegetables Chicory root, artichoke, asparagus, cabbage, snow peas, Brussels sprouts, broccoli, sugar snap peas, mushrooms, celery, and cauliflower. Onions, garlic,leeks, and the white part of scallions. Grains Wheat, including kamut, durum, and semolina. Barley and bulgur. Couscous.Wheat-based cereals. Wheat noodles, bread, crackers, and pastries. Meats and other proteins Fried or fatty meat. Sausage. Cashews and pistachios. Soybeans, baked beans, black beans, chickpeas, kidney beans, fava beans, navy beans, lentils,black-eyed peas, and split peas. Dairy Milk, yogurt, ice cream, and soft cheese. Cream and sour cream. Milk-basedsauces. Custard. Buttermilk. Soy milk. Seasoning and other foods Any sugar-free gum or candy. Foods that contain artificial sweeteners such as sorbitol, mannitol, isomalt, or xylitol. Foods that contain honey, high-fructose corn syrup, or agave. Bouillon, vegetable stock, beef stock, and chicken stock. Garlic and onion powder. Condiments made with onion, such ashummus, chutney, pickles, relish, salad dressing, and salsa. Tomato paste. Beverages Chicory-based drinks. Coffee substitutes. Chamomile tea. Fennel tea. Sweet or fortified  wines such as port or sherry. Diet soft drinks made with isomalt, mannitol, maltitol, sorbitol, or xylitol. Apple, pear, and mango juice. Juiceswith high-fructose corn syrup. The items listed above may not be a complete list of foods and beverages you should avoid. Contact a dietitian for more information. Summary FODMAP stands for fermentable oligosaccharides, disaccharides, monosaccharides, and polyols. These are sugars that are hard for some people to digest. A low-FODMAP eating plan is a short-term diet that helps to ease symptoms of certain bowel diseases. The eating plan usually lasts up to 6 weeks. After that, high-FODMAP foods are reintroduced gradually and one at a time. This can help you find out which foods may be causing symptoms. A low-FODMAP eating plan can be complicated. It is best to work with a dietitian who has experience with this type of plan. This information is not intended to replace advice given to you by your health care provider. Make sure you discuss any questions you have with your healthcare provider. Document Revised: 04/07/2020 Document Reviewed: 04/07/2020 Elsevier Patient Education  2022 [employer].

## 2021-06-29 LAB — IRON,TIBC AND FERRITIN PANEL
%SAT: 34 % (calc) (ref 16–45)
Ferritin: 30 ng/mL (ref 16–232)
Iron: 92 ug/dL (ref 40–190)
TIBC: 268 mcg/dL (calc) (ref 250–450)

## 2021-06-30 LAB — GASTROINTESTINAL PATHOGEN PANEL PCR
C. difficile Tox A/B, PCR: NOT DETECTED
Campylobacter, PCR: NOT DETECTED
Cryptosporidium, PCR: NOT DETECTED
E coli (ETEC) LT/ST PCR: NOT DETECTED
E coli (STEC) stx1/stx2, PCR: NOT DETECTED
E coli 0157, PCR: NOT DETECTED
Giardia lamblia, PCR: NOT DETECTED
Norovirus, PCR: NOT DETECTED
Rotavirus A, PCR: NOT DETECTED
Salmonella, PCR: NOT DETECTED
Shigella, PCR: NOT DETECTED

## 2021-07-12 ENCOUNTER — Telehealth: Payer: Self-pay

## 2021-07-12 DIAGNOSIS — M542 Cervicalgia: Secondary | ICD-10-CM

## 2021-07-12 NOTE — Telephone Encounter (Signed)
Spoke to pt told her referral has been placed as requested. Pt verbalized understanding.

## 2021-07-12 NOTE — Telephone Encounter (Signed)
Pt called reguardeing about referral to [facility] office on [address]. She stated that she has not been contacted yet but the referral was placed today 8/10. She would like it to be faxed to [phone].

## 2021-07-12 NOTE — Telephone Encounter (Signed)
Patient is requesting another referral to [facility], [place] with dr.kin  They facility states they need a new referral every six months Also patient is trying to get an apt at dr.Kins office on Friday at 9 am if we can send it over before hand  Please advise

## 2021-07-13 NOTE — Telephone Encounter (Signed)
Pt called back about status of the referral

## 2021-07-30 DIAGNOSIS — J040 Acute laryngitis: Secondary | ICD-10-CM

## 2021-07-30 DIAGNOSIS — J029 Acute pharyngitis, unspecified: Secondary | ICD-10-CM | POA: Diagnosis not present

## 2021-08-30 ENCOUNTER — Encounter: Payer: Self-pay | Admitting: Family Medicine

## 2021-08-30 ENCOUNTER — Other Ambulatory Visit: Payer: Self-pay | Admitting: Family Medicine

## 2021-08-30 ENCOUNTER — Telehealth (INDEPENDENT_AMBULATORY_CARE_PROVIDER_SITE_OTHER): Payer: No Typology Code available for payment source | Admitting: Family Medicine

## 2021-08-30 VITALS — Temp 98.0°F | Ht 65.0 in

## 2021-08-30 DIAGNOSIS — J069 Acute upper respiratory infection, unspecified: Secondary | ICD-10-CM | POA: Diagnosis not present

## 2021-08-30 DIAGNOSIS — J029 Acute pharyngitis, unspecified: Secondary | ICD-10-CM

## 2021-08-30 DIAGNOSIS — J040 Acute laryngitis: Secondary | ICD-10-CM

## 2021-08-30 LAB — POCT RAPID STREP A (OFFICE): Rapid Strep A Screen: NEGATIVE

## 2021-08-30 MED ORDER — MAGIC MOUTHWASH W/LIDOCAINE: 5.0000 mL | Freq: Three times a day (TID) | ORAL | 0 refills | Status: AC | PRN

## 2021-08-30 MED ORDER — MAGIC MOUTHWASH W/LIDOCAINE: 5.0000 mL | Freq: Three times a day (TID) | ORAL | 0 refills | Status: DC | PRN

## 2021-08-30 MED ORDER — MAGIC MOUTHWASH W/LIDOCAINE
5.0000 mL | Freq: Three times a day (TID) | ORAL | 0 refills | Status: DC | PRN
Start: 2021-08-30 — End: 2021-08-30

## 2021-08-30 NOTE — Progress Notes (Addendum)
Virtual Visit via Video Note I connected with Jaime Hampton on 08/30/21 by a video enabled telemedicine application and verified that I am speaking with the correct person using two identifiers.  Location patient: home Location provider:Home office Persons participating in the virtual visit: patient, provider  I discussed the limitations of evaluation and management by telemedicine and the availability of in person appointments. The patient expressed understanding and agreed to proceed.  Chief Complaint  Patient presents with   Sore Throat    Started Monday, no fever.    Generalized Body Aches    Started yesterday   HPI: Jaime Hampton is a 40 yo female with hx of allergies and GERD c/o sore throat and body aches as described above. Yesterday she started with dysphonia. Fatigue and chills. Throat redness and a couple of white spots noted.  Mild non productive cough and nausea. Negative for stridor,wheezing,SOB,CP,abdominal pain,vomiting,changes in bowel habits,urinary symptoms,or skin rash. She has taken Ibuprofen and increased fluid intake.  Her children were sick last week, treated with abx for ear infection. No recent travel. COVID 19 home test done this morning was negative.  ROS: See pertinent positives and negatives per HPI.  Past Medical History:  Diagnosis Date   Abnormal Pap smear    Allergy    Anxiety    Rosacea 05/27/2018   Past Surgical History:  Procedure Laterality Date   DILATION AND CURETTAGE OF UTERUS     Family History  Problem Relation Age of Onset   Varicose Veins Mother    Thrombocytopenia Mother    Pulmonary embolism Mother    Migraines Mother    Kidney Stones Father    Hypertension Father    Heart attack Father    Hyperlipidemia Father    Heart disease Maternal Grandmother    Diabetes Maternal Grandfather    Heart attack Maternal Grandfather    Heart disease Maternal Grandfather    Diabetes Paternal Grandfather    Anesthesia problems Neg Hx      Social History   Socioeconomic History   Marital status: Married    Spouse name: [email or web address]   Number of children: 2   Years of education: Not on file   Highest education level: Not on file  Occupational History   Occupation: Warden/ranger, 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, [employer]    Employer: [employer]  Tobacco Use   Smoking status: Never   Smokeless tobacco: Never  Vaping Use   Vaping Use: Never used  Substance and Sexual Activity   Alcohol use: No   Drug use: No   Sexual activity: Yes    Birth control/protection: None  Other Topics Concern   Not on file  Social History Narrative   Not on file   Social Determinants of Health   Financial Resource Strain: Not on file  Food Insecurity: Not on file  Transportation Needs: Not on file  Physical Activity: Not on file  Stress: Not on file  Social Connections: Not on file  Intimate Partner Violence: Not on file   Current Outpatient Medications:    hyoscyamine (LEVSIN SL) 0.125 MG SL tablet, Take 1 tablet (0.125 mg total) by mouth 3 (three) times daily as needed., Disp: 60 tablet, Rfl: 0   pantoprazole (PROTONIX) 40 MG tablet, Take 1 tablet (40 mg total) by mouth daily. Started taking on 11/01/19, Disp: 30 tablet, Rfl: 2   Probiotic Product (PROBIOTIC PO), Take by mouth., Disp: , Rfl:    fluticasone (FLONASE) 50 MCG/ACT nasal spray, Place 1 spray  into both nostrils daily as needed for allergies. , Disp: , Rfl:   EXAM:  VITALS per patient if applicable:Temp 98 F (36.7 C)   Ht 5\' 5"  (1.651 m)   BMI 21.47 kg/m   GENERAL: alert, oriented, appears well and in no acute distress  HEENT: atraumatic, conjunctiva clear, no obvious abnormalities on inspection of external nose and ears. Pharyngeal erythema. No exudate or edema. Uvula is centered. Mild dysphonia.  NECK: normal movements of the Lall and neck. No enlarged lymph nodes noted on inspection, soreness when she palpates areas, no significant enlarged glands.  LUNGS: on  inspection no signs of respiratory distress, breathing rate appears normal, no obvious gross SOB, gasping or wheezing  CV: no obvious cyanosis  MS: moves all visible extremities without noticeable abnormality  PSYCH/NEURO: pleasant and cooperative, no obvious depression or anxiety, speech and thought processing grossly intact  ASSESSMENT AND PLAN:  Discussed the following assessment and plan:  Sore throat - Plan: magic mouthwash w/lidocaine SOLN Most likely viral. Will bring her to the clinic for rapid strep. Gargles with saline and throat lozenges may help. Magic mouth wash with lidocaine for local relief sent to her pharmacy. Further recommendations according to lab result.  Laryngitis, acute We discussed Dx,prognosis,and treatment options. Voice rest.  URI, acute Symptoms suggests a viral etiology, symptomatic treatment recommended. Instructed to monitor for signs of complications, including new onset of fever among some, clearly instructed about warning signs. Plenty of po fluids, rest. F/U as needed.  I discussed the assessment and treatment plan with the patient. The patient was provided an opportunity to ask questions and all were answered. The patient agreed with the plan and demonstrated an understanding of the instructions.  Return if symptoms worsen or fail to improve.  Jaime Hampton , MD

## 2021-08-30 NOTE — Addendum Note (Signed)
Addended by: Kathreen Devoid on: 08/30/2021 09:50 AM   Modules accepted: Orders

## 2021-08-30 NOTE — Addendum Note (Signed)
Addended by: Kathreen Devoid on: 08/30/2021 04:35 PM   Modules accepted: Orders

## 2021-08-30 NOTE — Addendum Note (Signed)
Addended by: Kathreen Devoid on: 08/30/2021 04:52 PM   Modules accepted: Orders

## 2021-09-03 LAB — CULTURE, GROUP A STREP: Strep A Culture: NEGATIVE

## 2021-09-03 LAB — SPECIMEN STATUS REPORT

## 2021-09-14 ENCOUNTER — Encounter: Payer: Self-pay | Admitting: Family

## 2021-09-14 ENCOUNTER — Other Ambulatory Visit: Payer: Self-pay

## 2021-09-14 ENCOUNTER — Ambulatory Visit (INDEPENDENT_AMBULATORY_CARE_PROVIDER_SITE_OTHER): Payer: No Typology Code available for payment source | Admitting: Family

## 2021-09-14 VITALS — BP 120/84 | HR 73 | Temp 98.5°F | Ht 65.0 in | Wt 129.0 lb

## 2021-09-14 DIAGNOSIS — J029 Acute pharyngitis, unspecified: Secondary | ICD-10-CM

## 2021-09-14 DIAGNOSIS — R350 Frequency of micturition: Secondary | ICD-10-CM | POA: Diagnosis not present

## 2021-09-14 LAB — POCT URINALYSIS DIPSTICK
Bilirubin, UA: NEGATIVE
Blood, UA: POSITIVE
Glucose, UA: NEGATIVE
Ketones, UA: NEGATIVE
Leukocytes, UA: NEGATIVE
Nitrite, UA: NEGATIVE
Protein, UA: NEGATIVE
Spec Grav, UA: 1.015
Urobilinogen, UA: 0.2 U/dL
pH, UA: 6

## 2021-09-14 LAB — POCT RAPID STREP A (OFFICE): Rapid Strep A Screen: NEGATIVE

## 2021-09-14 NOTE — Patient Instructions (Addendum)
Your rapid strep tests and urinalysis are negative today. Contineu to drink at least 2 liters of water daily.  OK to take Ibuprofen up to 600mg  (after eating) or generic Tylenol 1,000mg  every 6 hours for throat pain. Gargle with warm, salt water several times a day to help with throat healing.

## 2021-09-14 NOTE — Progress Notes (Signed)
Subjective:     Patient ID: Jaime Hampton, female    DOB: 01-05-1981, 40 y.o.   MRN: [id number]  Chief Complaint  Patient presents with   Sore Throat    Symptoms started 3 weeks ago. Pt has had Step test that was negative. She says that Covid test was negative.    Urinary Frequency    Started 3 weeks ago. Denies back Hampton, fever, nausea or vomiting.     HPI Patient is in today for sore throat for 3 weeks, negative for strep when sx started. Denies any reflux sx. Or other sinus/cold sx. Also having urinary symptoms including dysuria, frequency, took AZO OTC and sx improved but then returned. Denies low back Hampton, fever, nausea or foul odor.  Health Maintenance Due  Topic Date Due   Hepatitis C Screening  Never done   INFLUENZA VACCINE  07/03/2021    Past Medical History:  Diagnosis Date   Abnormal Pap smear    Allergy    Anxiety    Rosacea 05/27/2018    Past Surgical History:  Procedure Laterality Date   DILATION AND CURETTAGE OF UTERUS      Outpatient Medications Prior to Visit  Medication Sig Dispense Refill   hyoscyamine (LEVSIN SL) 0.125 MG SL tablet Take 1 tablet (0.125 mg total) by mouth 3 (three) times daily as needed. 60 tablet 0   pantoprazole (PROTONIX) 40 MG tablet Take 1 tablet (40 mg total) by mouth daily. Started taking on 11/01/19 30 tablet 2   Probiotic Product (PROBIOTIC PO) Take by mouth.     fluticasone (FLONASE) 50 MCG/ACT nasal spray Place 1 spray into both nostrils daily as needed for allergies.      No facility-administered medications prior to visit.    No Known Allergies      Objective:    Physical Exam Vitals and nursing note reviewed.  Constitutional:      Appearance: Normal appearance.  HENT:     Right Ear: Tympanic membrane normal.     Left Ear: Tympanic membrane normal.     Nose: No congestion or rhinorrhea.     Mouth/Throat:     Mouth: Mucous membranes are moist.     Pharynx: Uvula midline. Posterior oropharyngeal erythema  (mild) present. No oropharyngeal exudate or uvula swelling.  Cardiovascular:     Rate and Rhythm: Normal rate and regular rhythm.  Pulmonary:     Effort: Pulmonary effort is normal.     Breath sounds: Normal breath sounds.  Musculoskeletal:        General: Normal range of motion.  Skin:    General: Skin is warm and dry.  Neurological:     Mental Status: She is alert.  Psychiatric:        Mood and Affect: Mood normal.        Behavior: Behavior normal.    BP 120/84   Pulse 73   Temp 98.5 F (36.9 C) (Temporal)   Ht 5\' 5"  (1.651 m)   Wt 129 lb (58.5 kg)   SpO2 100%   BMI 21.47 kg/m  Wt Readings from Last 3 Encounters:  09/14/21 129 lb (58.5 kg)  06/28/21 129 lb (58.5 kg)  03/24/20 133 lb (60.3 kg)       Assessment & Plan:   Problem List Items Addressed This Visit       Other   Urinary frequency - Primary    UA neg. Advised on increased water intake. Discussed not holding urine for long  periods, urination after intercourse, performing kegal exercises daily.      Relevant Orders   POCT Urinalysis Dipstick (Completed)   Sore throat    Rapid & culture tests for Strep are negative. Advised on analgesic meds, warm salt water gargles.      Relevant Orders   POCT rapid strep A (Completed)   Culture, Group A Strep (Completed)    I am having Jaime Hampton. Jaime Hampton maintain her fluticasone, Probiotic Product (PROBIOTIC PO), hyoscyamine, and pantoprazole.  No orders of the defined types were placed in this encounter.

## 2021-09-16 LAB — CULTURE, GROUP A STREP
MICRO NUMBER:: 12498917
SPECIMEN QUALITY:: ADEQUATE

## 2021-09-18 DIAGNOSIS — J029 Acute pharyngitis, unspecified: Secondary | ICD-10-CM | POA: Insufficient documentation

## 2021-09-18 DIAGNOSIS — R350 Frequency of micturition: Secondary | ICD-10-CM | POA: Insufficient documentation

## 2021-09-18 NOTE — Assessment & Plan Note (Signed)
UA neg. Advised on increased water intake. Discussed not holding urine for long periods, urination after intercourse, performing kegal exercises daily.

## 2021-09-18 NOTE — Assessment & Plan Note (Signed)
Rapid & culture tests for Strep are negative. Advised on analgesic meds, warm salt water gargles.

## 2021-12-26 ENCOUNTER — Telehealth: Payer: Self-pay | Admitting: Family Medicine

## 2021-12-26 NOTE — Telephone Encounter (Signed)
Spoke with pt

## 2021-12-26 NOTE — Telephone Encounter (Signed)
Pt would like a recommendation for ENT or Urologist. Does not need a referral just wanted a recommendation.

## 2022-08-27 ENCOUNTER — Encounter: Payer: Self-pay | Admitting: *Deleted

## 2022-11-15 ENCOUNTER — Encounter: Payer: Self-pay | Admitting: *Deleted

## 2023-08-02 DIAGNOSIS — Z713 Dietary counseling and surveillance: Secondary | ICD-10-CM | POA: Diagnosis not present

## 2023-08-02 DIAGNOSIS — Z6822 Body mass index (BMI) 22.0-22.9, adult: Secondary | ICD-10-CM | POA: Diagnosis not present

## 2023-08-02 DIAGNOSIS — Z131 Encounter for screening for diabetes mellitus: Secondary | ICD-10-CM | POA: Diagnosis not present

## 2023-08-02 DIAGNOSIS — Z13220 Encounter for screening for lipoid disorders: Secondary | ICD-10-CM | POA: Diagnosis not present

## 2023-09-04 ENCOUNTER — Encounter: Payer: 59 | Admitting: Family Medicine

## 2023-10-14 ENCOUNTER — Encounter: Payer: Self-pay | Admitting: Family Medicine

## 2023-10-14 ENCOUNTER — Telehealth: Payer: Self-pay | Admitting: Family Medicine

## 2023-10-14 DIAGNOSIS — N3001 Acute cystitis with hematuria: Secondary | ICD-10-CM | POA: Diagnosis not present

## 2023-10-14 NOTE — Telephone Encounter (Signed)
Patient states she tested positive for a UTI with an home test.  Requests RX for UTI be sent to:  [id number] - [place], [place] - [address] [place] [address] [place] [address] Phone: [phone]  Fax: [phone]      Patient declined scheduling appointment. Requested an Encounter be sent instead.

## 2023-10-15 NOTE — Telephone Encounter (Signed)
Patient states she went to Urgent Care 10/14/23.

## 2023-10-23 ENCOUNTER — Ambulatory Visit: Payer: 59 | Admitting: Family Medicine

## 2023-10-23 ENCOUNTER — Encounter: Payer: Self-pay | Admitting: Family Medicine

## 2023-10-23 VITALS — BP 130/64 | HR 89 | Temp 98.3°F | Ht 65.0 in | Wt 134.2 lb

## 2023-10-23 DIAGNOSIS — R350 Frequency of micturition: Secondary | ICD-10-CM

## 2023-10-23 DIAGNOSIS — Z87442 Personal history of urinary calculi: Secondary | ICD-10-CM | POA: Diagnosis not present

## 2023-10-23 DIAGNOSIS — R1032 Left lower quadrant pain: Secondary | ICD-10-CM

## 2023-10-23 LAB — POCT URINALYSIS DIPSTICK
Bilirubin, UA: NEGATIVE
Blood, UA: NEGATIVE
Glucose, UA: NEGATIVE
Ketones, UA: NEGATIVE
Leukocytes, UA: NEGATIVE
Nitrite, UA: NEGATIVE
Protein, UA: NEGATIVE
Spec Grav, UA: 1.02 (ref 1.010–1.025)
Urobilinogen, UA: 0.2 U/dL
pH, UA: 6 (ref 5.0–8.0)

## 2023-10-23 MED ORDER — TAMSULOSIN HCL 0.4 MG PO CAPS: 0.4000 mg | ORAL_CAPSULE | Freq: Every day | ORAL | 0 refills | Status: DC

## 2023-10-23 NOTE — Telephone Encounter (Signed)
Patient is scheduled for appointment 10/23/23

## 2023-10-23 NOTE — Progress Notes (Signed)
Subjective  CC:  Chief Complaint  Patient presents with   Urinary Frequency    Pt was Dx with a UTI about a week ago and still having pressure     HPI: Jaime Hampton is a 42 y.o. female who presents to the office today to address the problems listed above in the chief complaint. Discussed the use of AI scribe software for clinical note transcription with the patient, who gave verbal consent to proceed.  History of Present Illness   The patient, with a history of kidney stones, presents with urinary symptoms that have been ongoing for approximately a week and a half. She reports a sensation of pressure and increased urinary frequency, likening the discomfort to previous experiences with kidney stones. The patient denies dysuria and hematuria but notes some localized discomfort in the lower left quadrant. She also reports constipation.  The patient sought care at an urgent care center a week ago, where she was treated with antibiotics for a suspected urinary tract infection (UTI). The symptoms improved temporarily but returned after four days. The patient also took an over-the-counter UTI test at home, which showed mild leukocytes but was negative for nitrites.  The patient has a history of passing kidney stones without significant pain, often discovering them incidentally. She reports that the discomfort associated with the stones feels similar to the current symptoms. The patient also notes that her menstrual cycles have become heavier and irregular, suggesting possible perimenopause, but denies any associated vaginal symptoms.  The patient maintains a reasonable level of hydration, consuming approximately 1.5 liters of fluid daily, and her urine is typically light yellow in color. She reports that taking Advil and over-the-counter Azo has helped alleviate the discomfort and urinary frequency.      Assessment  1. Frequent urination   2. LLQ pain   3. History of nephrolithiasis       Plan  Assessment and Plan    Urinary Symptoms with Possible Kidney Stone Presents with urinary frequency, pressure, and mild discomfort for the past 1.5 weeks. History of kidney stones, but no recent UTI. Urine culture from urgent care was negative, and current urine sample appears clean. Differential includes kidney stone versus other GU, GYN, or GI issues including constipation. Discussed kidney stone causing symptoms without visible hematuria. Patient prefers empirical treatment for kidney stone with Flomax and increased hydration. - Prescribe Flomax once daily for one week - Encourage increased hydration until urine is light yellow or clear - Send urine sample for microscopy and culture - Advise monitoring for hematuria or increased pain - Recommend avoiding sodas and limiting caffeine intake - Consider CT scan if symptoms persist or worsen - Advise use of Advil for pain management  Constipation Reports constipation with localized discomfort. No significant changes in bowel habits or other GI symptoms. - Advise increased fluid intake - Recommend dietary adjustments to include more fiber  Perimenopausal Symptoms Reports heavier and irregular menstrual cycles, likely indicative of perimenopause. No acute GYN symptoms. - Monitor menstrual cycle and symptoms - Schedule follow-up with OB/GYN for further evaluation and management  General Health Maintenance Missed annual physical but has scheduled one for next year. Discussed importance of regular check-ups and screenings. - Ensure attendance at scheduled physical next year - Encourage regular health maintenance and screenings  Follow-up - Follow up if symptoms do not improve or worsen - Consider imaging and further evaluation if symptoms persist.      F/u for cpe next year Visit date not  found  Orders Placed This Encounter  Procedures   Urine Culture   Urine Microscopic Only   POCT Urinalysis Dipstick   No orders of the  defined types were placed in this encounter.     I reviewed the patients updated PMH, FH, and SocHx.    Patient Active Problem List   Diagnosis Date Noted   History of nephrolithiasis 10/23/2023    Priority: Medium    Oral contraceptive use 05/27/2018    Priority: Medium    GAD (generalized anxiety disorder) 05/27/2018    Priority: Medium    Laryngopharyngeal reflux (LPR) 04/26/2016    Priority: Medium    Persistent insomnia 07/13/2013    Priority: Medium    Rosacea 05/27/2018    Priority: Low   TMJ pain dysfunction syndrome 04/26/2016    Priority: Low   Current Meds  Medication Sig   Probiotic Product (PROBIOTIC PO) Take by mouth.    Allergies: Patient has No Known Allergies. Family History: Patient family history includes Diabetes in her maternal grandfather and paternal grandfather; Heart attack in her father and maternal grandfather; Heart disease in her maternal grandfather and maternal grandmother; Hyperlipidemia in her father; Hypertension in her father; Kidney Stones in her father; Migraines in her mother; Pulmonary embolism in her mother; Thrombocytopenia in her mother; Varicose Veins in her mother. Social History:  Patient  reports that she has never smoked. She has never used smokeless tobacco. She reports that she does not drink alcohol and does not use drugs.  Review of Systems: Constitutional: Negative for fever malaise or anorexia Cardiovascular: negative for chest pain Respiratory: negative for SOB or persistent cough Gastrointestinal: negative for abdominal pain  Objective  Vitals: BP 130/64   Pulse 89   Temp 98.3 F (36.8 C)   Ht 5\' 5"  (1.651 m)   Wt 134 lb 3.2 oz (60.9 kg)   SpO2 98%   BMI 22.33 kg/m  General: no acute distress , A&Ox3 HEENT: PEERL, conjunctiva normal, neck is supple Gastrointestinal: soft, flat abdomen, normal active bowel sounds, no palpable masses, no hepatosplenomegaly, no appreciated hernias Llq ttp w/o rebound ttp or  mass, no suprapubic ttp. No cvat Office Visit on 10/23/2023  Component Date Value Ref Range Status   Color, UA 10/23/2023 yellow   Final   Clarity, UA 10/23/2023 clear   Final   Glucose, UA 10/23/2023 Negative  Negative Final   Bilirubin, UA 10/23/2023 negative   Final   Ketones, UA 10/23/2023 negative   Final   Spec Grav, UA 10/23/2023 1.020  1.010 - 1.025 Final   Blood, UA 10/23/2023 negative   Final   pH, UA 10/23/2023 6.0  5.0 - 8.0 Final   Protein, UA 10/23/2023 Negative  Negative Final   Urobilinogen, UA 10/23/2023 0.2  0.2 or 1.0 E.U./dL Final   Nitrite, UA 16/09/9603 negative   Final   Leukocytes, UA 10/23/2023 Negative  Negative Final    Commons side effects, risks, benefits, and alternatives for medications and treatment plan prescribed today were discussed, and the patient expressed understanding of the given instructions. Patient is instructed to call or message via MyChart if he/she has any questions or concerns regarding our treatment plan. No barriers to understanding were identified. We discussed Red Flag symptoms and signs in detail. Patient expressed understanding regarding what to do in case of urgent or emergency type symptoms.  Medication list was reconciled, printed and provided to the patient in AVS. Patient instructions and summary information was reviewed with the patient  as documented in the AVS. This note was prepared with assistance of Dragon voice recognition software. Occasional wrong-word or sound-a-like substitutions may have occurred due to the inherent limitations of voice recognition software

## 2023-10-23 NOTE — Patient Instructions (Addendum)
Please return for your annual complete physical; please come fasting.   I will release your lab results to you on your MyChart account with further instructions. You may see the results before I do, but when I review them I will send you a message with my report or have my assistant call you if things need to be discussed. Please reply to my message with any questions. Thank you!   If you have any questions or concerns, please don't hesitate to send me a message via MyChart or call the office at [phone]. Thank you for visiting with [place] today! It's our pleasure caring for you.   VISIT SUMMARY:  During today's visit, we discussed your ongoing urinary symptoms, which have been present for about a week and a half. You reported a sensation of pressure and increased urinary frequency, similar to your previous experiences with kidney stones. We also addressed your constipation and heavier, irregular menstrual cycles, which may indicate perimenopause. Additionally, we reviewed your general health maintenance and the importance of regular check-ups.  YOUR PLAN:  -URINARY SYMPTOMS WITH POSSIBLE KIDNEY STONE: Your symptoms of urinary frequency, pressure, and mild discomfort may be due to a kidney stone, even though there is no visible blood in your urine. IT could also be due to constipation, pelvic pain or another cause. We will treat this empirically with Flomax, which helps relax the muscles in the urinary tract, and encourage you to increase your hydration. We will also send a urine sample for further testing and advise you to monitor for any signs of blood in your urine or increased pain. Avoid sodas and limit caffeine intake. If your symptoms persist or worsen, we may consider a CT scan. You can continue using Advil for pain management.  -CONSTIPATION: Constipation is when you have infrequent or difficult bowel movements. To help alleviate this, we recommend increasing your fluid intake and making  dietary adjustments to include more fiber.   INSTRUCTIONS:  Please follow up if your symptoms do not improve or worsen. We may consider imaging and further evaluation if your symptoms persist.

## 2023-10-24 LAB — URINALYSIS, MICROSCOPIC ONLY

## 2023-10-24 LAB — URINE CULTURE
MICRO NUMBER:: 15757008
Result:: NO GROWTH
SPECIMEN QUALITY:: ADEQUATE

## 2023-10-24 NOTE — Progress Notes (Signed)
See mychart note Dear Ms. Brander, Here are the results from the microscopic analysis of your urine. It DOES show WBCs which can indicate infection (but can also be due to inflammation); there is no blood. And you do have calcium oxalate crystals.  So, we have to wait and see if the culture shows infection or not. Using the flomax may still be helpful. And if the culture is negative and you are not improving, then a CT scan and maybe even a urology referral will be needed.  Hope that all makes sense. I will wait on the final culture results and reach back out to see how you are doing; then I can tell you what is needed.   Sincerely, Dr. Mardelle Matte

## 2023-10-25 NOTE — Progress Notes (Signed)
See mychart note Fyi: no urine infection. Possible (but unclear) kidney stone vs other.  So, if she calls next week w/ persistent or worsening sxs, would need renal stone ct vs urology referral.  Thanks!

## 2023-10-26 ENCOUNTER — Encounter: Payer: Self-pay | Admitting: Family Medicine

## 2024-01-27 ENCOUNTER — Ambulatory Visit: Payer: Self-pay | Admitting: Family Medicine

## 2024-01-27 NOTE — Telephone Encounter (Signed)
 Chief Complaint: Join pain Symptoms: Joint pain, numbness Frequency: Off and on Pertinent Negatives: Patient denies Redness, swelling, injury, fever, heat. Immobility, pain radiation Disposition: [] ED /[] Urgent Care (no appt availability in office) / [x] Appointment(In office/virtual)/ []  [place] [facility]/ [] Home Care/ [] Refused Recommended Disposition /[] [place] [facility]/ []  Follow-up with PCP Additional Notes: Patient called with complaints of joint pain x3 months. Patient states that she has joint pain in her left big toe, knees, right elbow, and left hip, with the left big toe being the strangest and worse of the symptoms. Patient states that her toe feels like, "someone is sticking a needle up my toe," when she points it or when the sheets rubs over it during sleeping. Patient states her toe symptoms are worse at night, and has begun to notice some numbness on the right outer side of her left big toe in the last week. Patient states that she has a bunion on her left big toe but unsure of what could be causing her symptoms. Patient also states that she has random bilateral knee pain, right elbow pain (within the last week) and left hip pain (3 months). Patient states that the left hip pain is worse after sitting for a while then standing/walking, "It gets stiff."  Patient states that pain varies but is a 5/10 and denies taking any medication. Patient states that walking/hiking/yoga/exercises can aggravate her pain, but the left hip is sometimes relieved with movement after sitting for a long period of time. Patient denies redness, swelling, heat/warmth, blue/cyanotic skin, injury, radiation of pain.Patient advised by this RN to be seen within the next two weeks to which patient was agreeable. Appt scheduled. Patient advised by this RN to call back with worsening symptoms. Patient verbalized understanding.    Copied from CRM [id number]. Topic: Clinical - Red Word Triage >> Jan 27, 2024  1:01  PM Chantha C wrote: [facility] that prompted transfer to Nurse Triage: Patient states joint pain sharp pain in big toe left foot, left hip, right elbow and both knees, feeling numbness in toe left foot,  worried about having gout. Patient denies a fever. Please advise [phone]. Reason for Disposition  [1] MILD pain (e.g., does not interfere with normal activities) AND [2] present > 7 days  Answer Assessment - Initial Assessment Questions 1. ONSET: "When did the pain start?"      3 months, 2. LOCATION: "Where is the pain located?"      Knee, right elbow (last week), left hip (last three weeks 3. PAIN: "How bad is the pain?"    (Scale 1-10; or mild, moderate, severe)   -  MILD (1-3): doesn't interfere with normal activities    -  MODERATE (4-7): interferes with normal activities (e.g., work or school) or awakens from sleep, limping    -  SEVERE (8-10): excruciating pain, unable to do any normal activities, unable to walk     "It varies, but probably worse like a five."  4. WORK OR EXERCISE: "Has there been any recent work or exercise that involved this part of the body?"      "I dont know what could be causing it but I have noticed that yoga, walking, hiking - all of it makes it worse." 5. CAUSE: "What do you think is causing the leg pain?"     "Im not sure. It feels like my body is just breaking down. I have a bunion on my left toe." 6. OTHER SYMPTOMS: "Do you have any other  symptoms?" (e.g., chest pain, back pain, breathing difficulty, swelling, rash, fever, numbness, weakness)     Left hip "I get stiff and when I stand up it hurts, yoga." Left toe "worse with bending/pointing "feels like someone is sticking a needle up my toe . It's worse at night and when the sheet kind of runs over it. The Knees are more random."  Protocols used: Leg Pain-A-AH

## 2024-01-27 NOTE — Telephone Encounter (Signed)
 Noted.

## 2024-02-05 ENCOUNTER — Ambulatory Visit: Payer: 59 | Admitting: Family Medicine

## 2024-02-19 ENCOUNTER — Ambulatory Visit: Admitting: Family Medicine

## 2024-03-04 DIAGNOSIS — Z01419 Encounter for gynecological examination (general) (routine) without abnormal findings: Secondary | ICD-10-CM | POA: Diagnosis not present

## 2024-03-04 DIAGNOSIS — N943 Premenstrual tension syndrome: Secondary | ICD-10-CM | POA: Diagnosis not present

## 2024-03-04 DIAGNOSIS — N920 Excessive and frequent menstruation with regular cycle: Secondary | ICD-10-CM | POA: Diagnosis not present

## 2024-03-05 LAB — HM PAP SMEAR: HPV, high-risk: NEGATIVE

## 2024-03-11 ENCOUNTER — Encounter: Payer: Self-pay | Admitting: Family Medicine

## 2024-03-11 ENCOUNTER — Other Ambulatory Visit: Payer: Self-pay

## 2024-03-11 ENCOUNTER — Telehealth: Payer: Self-pay

## 2024-03-11 ENCOUNTER — Ambulatory Visit (INDEPENDENT_AMBULATORY_CARE_PROVIDER_SITE_OTHER): Admitting: Family Medicine

## 2024-03-11 VITALS — BP 115/75 | HR 85 | Temp 98.1°F | Ht 65.0 in | Wt 132.4 lb

## 2024-03-11 DIAGNOSIS — M7062 Trochanteric bursitis, left hip: Secondary | ICD-10-CM | POA: Diagnosis not present

## 2024-03-11 DIAGNOSIS — M25511 Pain in right shoulder: Secondary | ICD-10-CM | POA: Diagnosis not present

## 2024-03-11 DIAGNOSIS — G8929 Other chronic pain: Secondary | ICD-10-CM

## 2024-03-11 DIAGNOSIS — M79675 Pain in left toe(s): Secondary | ICD-10-CM | POA: Diagnosis not present

## 2024-03-11 DIAGNOSIS — N951 Menopausal and female climacteric states: Secondary | ICD-10-CM

## 2024-03-11 MED ORDER — DICLOFENAC SODIUM 75 MG PO TBEC: 75.0000 mg | DELAYED_RELEASE_TABLET | Freq: Two times a day (BID) | ORAL | 0 refills | Status: DC

## 2024-03-11 NOTE — Patient Instructions (Signed)
 Please follow up as scheduled for your next visit with me: 06/10/2024   If you have any questions or concerns, please don't hesitate to send me a message via MyChart or call the office at [phone]. Thank you for visiting with [place] today! It's our pleasure caring for you.   VISIT SUMMARY:  During your visit, we discussed your toe and joint pain, changes in your menstrual cycle, and general inflammation concerns. We reviewed your symptoms and created a plan to address each issue.  YOUR PLAN:  -BUNION WITH SUSPECTED ARTHRITIS: You have a bunion with suspected arthritis in your toe joint, likely due to wear and tear. We have prescribed diclofenac 75 mg to be taken twice daily for 1-2 weeks and advised you to continue using toe separators.  -HIP BURSITIS: You have hip bursitis, which is inflammation of the hip joint often caused by physical activities. We have prescribed diclofenac 75 mg to be taken twice daily for 1-2 weeks, recommended icing and stretching exercises, and may consider a steroid injection if there is no improvement.  -GENERALIZED JOINT PAIN: Your joint pain in the elbows, shoulders, and knees is likely due to wear and tear. We have prescribed diclofenac 75 mg to be taken twice daily for 1-2 weeks and referred you to sports medicine for further evaluation.  -PERIMENOPAUSAL SYMPTOMS: You are experiencing symptoms that may be related to perimenopause, which is the transition period before menopause. We discussed hormone management and monitoring your menstrual cycles. Hormone replacement therapy (HRT) may be considered if your symptoms worsen.  -GENERAL HEALTH MAINTENANCE: We discussed using turmeric for managing inflammation and exploring integrative health practices. Emphasized maintaining a healthy lifestyle.  INSTRUCTIONS:  Please ensure you follow up with your primary care provider for a physical examination and blood work. Additionally, follow up with sports medicine for further  evaluation of your joint pain.

## 2024-03-11 NOTE — Telephone Encounter (Signed)
 Copied from CRM [id number]. Topic: Clinical - Prescription Issue >> Mar 11, 2024  8:46 AM Truddie Crumble wrote: Reason for CRM: patient called stating she saw the doctor this morning and she stated there was a medication called into [facility] in [place], but the patient stated she changed her pharmacy to [facility] in [place]
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
## 2024-04-29 ENCOUNTER — Ambulatory Visit: Admitting: Sports Medicine

## 2024-05-01 ENCOUNTER — Ambulatory Visit: Payer: Self-pay

## 2024-05-01 NOTE — Telephone Encounter (Signed)
 Noted

## 2024-05-01 NOTE — Telephone Encounter (Addendum)
  Chief Complaint: hematuria  Symptoms: pink/tea colored urine, some lower abd pain Frequency: since this AM Pertinent Negatives: Patient denies fever, urinary urgency/pain Disposition: [] ED /[x] Urgent Care (no appt availability in office) / [] Appointment(In office/virtual)/ []  [place] [facility]/ [] Home Care/ [] Refused Recommended Disposition /[] [place] [facility]/ []  Follow-up with PCP Additional Notes: Pt c/o hematuria x 2 events today. Pt reports pink/tea colored urine. Denies any fever, urinary urgency/pain and is not on any blood thinners or ASA. Triager attempted to schedule with PCP, but no access. Offered to schedule with [facility], but pt has concerns with higher co-pay/insurance. Triager advised on disposition. Patient verbalized understanding and to go to UC today or tomorrow.    Copied from CRM [id number]. Topic: Clinical - Red Word Triage >> May 01, 2024  1:08 PM Kita Perish H wrote: [facility] that prompted transfer to Nurse Triage: Blood in urine, some discomfort in lower abdomen Reason for Disposition  Blood in urine  (Exception: Could be normal menstrual bleeding.)  Answer Assessment - Initial Assessment Questions 1. COLOR of URINE: "Describe the color of the urine."  (e.g., tea-colored, pink, red, bloody) "Do you have blood clots in your urine?" (e.g., none, pea, grape, small coin)     Blood in urine - tea color on the pinker side 2. ONSET: "When did the bleeding start?"      Early this AM Initially thought r/t cycle 3. EPISODES: "How many times has there been blood in the urine?" or "How many times today?"     X 2 4. PAIN with URINATION: "Is there any pain with passing your urine?" If Yes, ask: "How bad is the pain?"  (Scale 1-10; or mild, moderate, severe)    - MILD: Complains slightly about urination hurting.    - MODERATE: Interferes with normal activities.      - SEVERE: Excruciating, unwilling or unable to urinate because of the pain.      denies 5. FEVER: "Do you  have a fever?" If Yes, ask: "What is your temperature, how was it measured, and when did it start?"     denies 6. ASSOCIATED SYMPTOMS: "Are you passing urine more frequently than usual?"     denies 7. OTHER SYMPTOMS: "Do you have any other symptoms?" (e.g., back/flank pain, abdomen pain, vomiting)     Lower  abd pain 8. PREGNANCY: "Is there any chance you are pregnant?" "When was your last menstrual period?"     denies  Protocols used: Urine - Blood In-A-AH

## 2024-05-21 NOTE — Progress Notes (Deleted)
    Ben Jackson D.[facility] [address] [place] [address] Phone: [phone]   Assessment and Plan:     There are no diagnoses linked to this encounter.  ***   Pertinent previous records reviewed include ***    Follow Up: ***     Subjective:   I, Luba Matzen, am serving as a Neurosurgeon for Doctor Ulysees Gander  Chief Complaint: left hip and right shoulder   HPI:   05/22/2024 Patient is a 43 year old female with left hip and right shoulder pain. Patient states   Relevant Historical Information: ***  Additional pertinent review of systems negative.   Current Outpatient Medications:    diclofenac  (VOLTAREN ) 75 MG EC tablet, Take 1 tablet (75 mg total) by mouth 2 (two) times daily., Disp: 30 tablet, Rfl: 0   fluticasone (FLONASE) 50 MCG/ACT nasal spray, Place 1 spray into both nostrils daily as needed for allergies. , Disp: , Rfl:    Probiotic Product (PROBIOTIC PO), Take by mouth., Disp: , Rfl:    tamsulosin  (FLOMAX ) 0.4 MG CAPS capsule, Take 1 capsule (0.4 mg total) by mouth daily., Disp: 10 capsule, Rfl: 0   Objective:     There were no vitals filed for this visit.    There is no height or weight on file to calculate BMI.    Physical Exam:    ***   Electronically signed by:  Marshall Skeeter D.[facility] 7:48 AM 05/21/24

## 2024-05-22 ENCOUNTER — Ambulatory Visit: Admitting: Sports Medicine

## 2024-06-10 ENCOUNTER — Ambulatory Visit: Payer: Self-pay | Admitting: Family Medicine

## 2024-06-10 ENCOUNTER — Ambulatory Visit (INDEPENDENT_AMBULATORY_CARE_PROVIDER_SITE_OTHER): Payer: 59 | Admitting: Family Medicine

## 2024-06-10 ENCOUNTER — Encounter: Payer: Self-pay | Admitting: Family Medicine

## 2024-06-10 VITALS — BP 122/82 | HR 77 | Temp 98.0°F | Ht 65.0 in | Wt 134.0 lb

## 2024-06-10 DIAGNOSIS — Z0000 Encounter for general adult medical examination without abnormal findings: Secondary | ICD-10-CM | POA: Diagnosis not present

## 2024-06-10 DIAGNOSIS — Z1159 Encounter for screening for other viral diseases: Secondary | ICD-10-CM

## 2024-06-10 DIAGNOSIS — Z1211 Encounter for screening for malignant neoplasm of colon: Secondary | ICD-10-CM

## 2024-06-10 LAB — CBC WITH DIFFERENTIAL/PLATELET
Basophils Absolute: 0 K/uL (ref 0.0–0.1)
Basophils Relative: 0.5 % (ref 0.0–3.0)
Eosinophils Absolute: 0.2 K/uL (ref 0.0–0.7)
Eosinophils Relative: 3.3 % (ref 0.0–5.0)
HCT: 38.9 % (ref 36.0–46.0)
Hemoglobin: 13.3 g/dL (ref 12.0–15.0)
Lymphocytes Relative: 28.4 % (ref 12.0–46.0)
Lymphs Abs: 2.1 K/uL (ref 0.7–4.0)
MCHC: 34.3 g/dL (ref 30.0–36.0)
MCV: 91 fl (ref 78.0–100.0)
Monocytes Absolute: 0.8 K/uL (ref 0.1–1.0)
Monocytes Relative: 10.9 % (ref 3.0–12.0)
Neutro Abs: 4.2 K/uL (ref 1.4–7.7)
Neutrophils Relative %: 56.9 % (ref 43.0–77.0)
Platelets: 309 K/uL (ref 150.0–400.0)
RBC: 4.27 Mil/uL (ref 3.87–5.11)
RDW: 13.4 % (ref 11.5–15.5)
WBC: 7.4 K/uL (ref 4.0–10.5)

## 2024-06-10 LAB — COMPREHENSIVE METABOLIC PANEL WITH GFR
ALT: 17 U/L (ref 0–35)
AST: 15 U/L (ref 0–37)
Albumin: 4.6 g/dL (ref 3.5–5.2)
Alkaline Phosphatase: 28 U/L — ABNORMAL LOW (ref 39–117)
BUN: 15 mg/dL (ref 6–23)
CO2: 27 meq/L (ref 19–32)
Calcium: 9.5 mg/dL (ref 8.4–10.5)
Chloride: 105 meq/L (ref 96–112)
Creatinine, Ser: 0.85 mg/dL (ref 0.40–1.20)
GFR: 83.12 mL/min (ref >60.00)
Glucose, Bld: 79 mg/dL (ref 70–99)
Potassium: 4 meq/L (ref 3.5–5.1)
Sodium: 140 meq/L (ref 135–145)
Total Bilirubin: 0.9 mg/dL (ref 0.2–1.2)
Total Protein: 7 g/dL (ref 6.0–8.3)

## 2024-06-10 LAB — LIPID PANEL
Cholesterol: 197 mg/dL (ref 0–200)
HDL: 64.6 mg/dL (ref >39.00)
LDL Cholesterol: 114 mg/dL — ABNORMAL HIGH (ref 0–99)
NonHDL: 131.91
Total CHOL/HDL Ratio: 3
Triglycerides: 91 mg/dL (ref 0.0–149.0)
VLDL: 18.2 mg/dL (ref 0.0–40.0)

## 2024-06-10 LAB — TSH: TSH: 1.39 u[IU]/mL (ref 0.35–5.50)

## 2024-06-10 NOTE — Progress Notes (Signed)
 Subjective  Chief Complaint  Patient presents with   Annual Exam    Pt here for Annual Exam and is not currently fasting     HPI: Jaime Hampton is a 43 y.o. female who presents to [facility] [place] today for a Female Wellness Visit. She also has the concerns and/or needs as listed above in the chief complaint. These will be addressed in addition to the Health Maintenance Visit.   Wellness Visit: annual visit with health maintenance review and exam  HM: sees gyn. Pap and mammo reportedly current. Will call for records for mammo. Will be eligible for crc screen in October. Average risk. Healthy lifestyle, 2 sons, 6yo and 13yo. Perimenopausal with regular cycles. Imms current. Eligible for hep c screening.  Chronic disease f/u and/or acute problem visit: (deemed necessary to be done in addition to the wellness visit): Mild insomnia managed with melatonin prn Toe pain/bunion and shoulder pain: will see sm soon. Taking an otc supplement which has helped with joint pains. No new sxs  Assessment  1. Annual physical exam   2. Need for hepatitis C screening test      Plan  Female Wellness Visit: Age appropriate Health Maintenance and Prevention measures were discussed with patient. Included topics are cancer screening recommendations, ways to keep healthy (see AVS) including dietary and exercise recommendations, regular eye and dental care, use of seat belts, and avoidance of moderate alcohol use and tobacco use. Discussed CRC options: elects cologuard. Check on status of mammo.  BMI: discussed patient's BMI and encouraged positive lifestyle modifications to help get to or maintain a target BMI. HM needs and immunizations were addressed and ordered. See below for orders. See HM and immunization section for updates. Routine labs and screening tests ordered including cmp, cbc and lipids where appropriate. Discussed recommendations regarding Vit D and calcium supplementation  (see AVS)  Chronic disease management visit and/or acute problem visit: F/u with SM as needed.   Follow up: 12 mo for cpe  Orders Placed This Encounter  Procedures   CBC with Differential/Platelet   Comprehensive metabolic panel with GFR   Lipid panel   TSH   Hepatitis C antibody   No orders of the defined types were placed in this encounter.     Body mass index is 22.3 kg/m. Wt Readings from Last 3 Encounters:  06/10/24 134 lb (60.8 kg)  03/11/24 132 lb 6.4 oz (60.1 kg)  10/23/23 134 lb 3.2 oz (60.9 kg)     Patient Active Problem List   Diagnosis Date Noted   History of nephrolithiasis 10/23/2023    Priority: Medium    GAD (generalized anxiety disorder) 05/27/2018    Priority: Medium     Treated short term with buspar; never SSRI    Laryngopharyngeal reflux (LPR) 04/26/2016    Priority: Medium    Persistent insomnia 07/13/2013    Priority: Medium     melatonin    Rosacea 05/27/2018    Priority: Low   TMJ pain dysfunction syndrome 04/26/2016    Priority: Low   Health Maintenance  Topic Date Due   Hepatitis C Screening  Never done   Hepatitis B Vaccines (1 of 3 - 19+ 3-dose series) Never done   HPV VACCINES (1 - 3-dose SCDM series) Never done   COVID-19 Vaccine (1 - 2024-25 season) 06/26/2024 (Originally 08/04/2023)   INFLUENZA VACCINE  07/03/2024   DTaP/Tdap/Td (3 - Td or Tdap) 07/04/2027   Cervical Cancer Screening (HPV/Pap  Cotest)  03/04/2029   HIV Screening  Completed   Meningococcal B Vaccine  Aged Out   Immunization History  Administered Date(s) Administered   Influenza Split 11/04/2007   Influenza,inj,Quad PF,6+ Mos 10/03/2023   Influenza,inj,quad, With Preservative 09/30/2013   Influenza-Unspecified 09/02/2014   Tdap 03/08/2012, 07/03/2017   We updated and reviewed the patient's past history in detail and it is documented below. Allergies: Patient has no known allergies. Past Medical History Patient  has a past medical history of Abnormal Pap  smear, Allergy, Anxiety, Oral contraceptive use (05/27/2018), and Rosacea (05/27/2018). Past Surgical History Patient  has a past surgical history that includes Dilation and curettage of uterus. Family History: Patient family history includes Diabetes in her maternal grandfather and paternal grandfather; Heart attack in her father and maternal grandfather; Heart disease in her maternal grandfather and maternal grandmother; Hyperlipidemia in her father; Hypertension in her father; Kidney Stones in her father; Migraines in her mother; Pulmonary embolism in her mother; Thrombocytopenia in her mother; Varicose Veins in her mother. Social History:  Patient  reports that she has never smoked. She has never used smokeless tobacco. She reports that she does not drink alcohol and does not use drugs.  Review of Systems: Constitutional: negative for fever or malaise Ophthalmic: negative for photophobia, double vision or loss of vision Cardiovascular: negative for chest pain, dyspnea on exertion, or new LE swelling Respiratory: negative for SOB or persistent cough Gastrointestinal: negative for abdominal pain, change in bowel habits or melena Genitourinary: negative for dysuria or gross hematuria, no abnormal uterine bleeding or disharge Musculoskeletal: negative for new gait disturbance or muscular weakness Integumentary: negative for new or persistent rashes, no breast lumps Neurological: negative for TIA or stroke symptoms Psychiatric: negative for SI or delusions Allergic/Immunologic: negative for hives  Patient Care Team    Relationship Specialty Notifications Start End  Jodie Lavern CROME, MD PCP - General Family Medicine  05/27/18   Rutherford Gain, MD Consulting Physician Obstetrics and Gynecology  05/27/18   Dr. Kennyth  Ophthalmology  05/27/18   Dr. Reynold  Dentistry  05/27/18   Helga Slice, MD Attending Physician Dermatology  05/27/18     Objective  Vitals: BP 122/82   Pulse 77   Temp 98  F (36.7 C)   Ht 5' 5 (1.651 m)   Wt 134 lb (60.8 kg)   SpO2 97%   BMI 22.30 kg/m  General:  Well developed, well nourished, no acute distress  Psych:  Alert and orientedx3,normal mood and affect HEENT:  Normocephalic, atraumatic, non-icteric sclera,  supple neck without adenopathy, mass or thyromegaly Cardiovascular:  Normal S1, S2, RRR without gallop, rub or murmur Respiratory:  Good breath sounds bilaterally, CTAB with normal respiratory effort Gastrointestinal: normal bowel sounds, soft, non-tender, no noted masses. No HSM MSK: extremities without edema, joints without erythema or swelling Neurologic:    Mental status is normal.  Gross motor and sensory exams are normal.  No tremor  Commons side effects, risks, benefits, and alternatives for medications and treatment plan prescribed today were discussed, and the patient expressed understanding of the given instructions. Patient is instructed to call or message via MyChart if he/she has any questions or concerns regarding our treatment plan. No barriers to understanding were identified. We discussed Red Flag symptoms and signs in detail. Patient expressed understanding regarding what to do in case of urgent or emergency type symptoms.  Medication list was reconciled, printed and provided to the patient in AVS. Patient instructions and summary information  was reviewed with the patient as documented in the AVS. This note was prepared with assistance of Dragon voice recognition software. Occasional wrong-word or sound-a-like substitutions may have occurred due to the inherent limitations of voice recognition software

## 2024-06-10 NOTE — Progress Notes (Signed)
 Labs reviewed.  The 10-year ASCVD risk score (Arnett DK, et al., 2019) is: 0.5%   Values used to calculate the score:     Age: 43 years     Clincally relevant sex: Female     Is Non-Hispanic African American: No     Diabetic: No     Tobacco smoker: No     Systolic Blood Pressure: 122 mmHg     Is BP treated: No     HDL Cholesterol: 64.6 mg/dL     Total Cholesterol: 197 mg/dL

## 2024-06-10 NOTE — Patient Instructions (Signed)
 Please return in 12 months for your annual complete physical; please come fasting.   I will release your lab results to you on your MyChart account with further instructions. You may see the results before I do, but when I review them I will send you a message with my report or have my assistant call you if things need to be discussed. Please reply to my message with any questions. Thank you!   If you have any questions or concerns, please don't hesitate to send me a message via MyChart or call the office at [phone]. Thank you for visiting with us  today! It's our pleasure caring for you.   I recommend the Cologuard test for your colon cancer screening that is due. I have ordered this test for you. The Cologuard company will soon contact you to verify your insurance, address etc. They will then send you the kit; follow the instructions in the kit and return the kit to [employer]. They will run the test and send the results to me. I will then give you the results. If this test is negative, we recommend repeating a colon cancer screening test in 3 years. If it is positive, I will refer you to a Gastroenterologist so you can get set up for the recommended colonoscopy.  Thank you!   Preventive Care 83-45 Years Old, Female Preventive care refers to lifestyle choices and visits with your health care provider that can promote health and wellness. Preventive care visits are also called wellness exams. What can I expect for my preventive care visit? Counseling Your health care provider may ask you questions about your: Medical history, including: Past medical problems. Family medical history. Pregnancy history. Current health, including: Menstrual cycle. Method of birth control. Emotional well-being. Home life and relationship well-being. Sexual activity and sexual health. Lifestyle, including: Alcohol, nicotine or tobacco, and drug use. Access to firearms. Diet, exercise, and sleep  habits. Work and work Astronomer. Sunscreen use. Safety issues such as seatbelt and bike helmet use. Physical exam Your health care provider will check your: Height and weight. These may be used to calculate your BMI (body mass index). BMI is a measurement that tells if you are at a healthy weight. Waist circumference. This measures the distance around your waistline. This measurement also tells if you are at a healthy weight and may help predict your risk of certain diseases, such as type 2 diabetes and high blood pressure. Heart rate and blood pressure. Body temperature. Skin for abnormal spots. What immunizations do I need?  Vaccines are usually given at various ages, according to a schedule. Your health care provider will recommend vaccines for you based on your age, medical history, and lifestyle or other factors, such as travel or where you work. What tests do I need? Screening Your health care provider may recommend screening tests for certain conditions. This may include: Lipid and cholesterol levels. Diabetes screening. This is done by checking your blood sugar (glucose) after you have not eaten for a while (fasting). Pelvic exam and Pap test. Hepatitis B test. Hepatitis C test. HIV (human immunodeficiency virus) test. STI (sexually transmitted infection) testing, if you are at risk. Lung cancer screening. Colorectal cancer screening. Mammogram. Talk with your health care provider about when you should start having regular mammograms. This may depend on whether you have a family history of breast cancer. BRCA-related cancer screening. This may be done if you have a family history of breast, ovarian, tubal, or peritoneal cancers. Bone density scan.  This is done to screen for osteoporosis. Talk with your health care provider about your test results, treatment options, and if necessary, the need for more tests. Follow these instructions at home: Eating and drinking  Eat a diet  that includes fresh fruits and vegetables, whole grains, lean protein, and low-fat dairy products. Take vitamin and mineral supplements as recommended by your health care provider. Do not drink alcohol if: Your health care provider tells you not to drink. You are pregnant, may be pregnant, or are planning to become pregnant. If you drink alcohol: Limit how much you have to 0-1 drink a day. Know how much alcohol is in your drink. In the [place] one drink equals one 12 oz bottle of beer (355 mL), one 5 oz glass of wine (148 mL), or one 1 oz glass of hard liquor (44 mL). Lifestyle Brush your teeth every morning and night with fluoride toothpaste. Floss one time each day. Exercise for at least 30 minutes 5 or more days each week. Do not use any products that contain nicotine or tobacco. These products include cigarettes, chewing tobacco, and vaping devices, such as e-cigarettes. If you need help quitting, ask your health care provider. Do not use drugs. If you are sexually active, practice safe sex. Use a condom or other form of protection to prevent STIs. If you do not wish to become pregnant, use a form of birth control. If you plan to become pregnant, see your health care provider for a prepregnancy visit. Take aspirin only as told by your health care provider. Make sure that you understand how much to take and what form to take. Work with your health care provider to find out whether it is safe and beneficial for you to take aspirin daily. Find healthy ways to manage stress, such as: Meditation, yoga, or listening to music. Journaling. Talking to a trusted person. Spending time with friends and family. Minimize exposure to UV radiation to reduce your risk of skin cancer. Safety Always wear your seat belt while driving or riding in a vehicle. Do not drive: If you have been drinking alcohol. Do not ride with someone who has been drinking. When you are tired or distracted. While texting. If  you have been using any mind-altering substances or drugs. Wear a helmet and other protective equipment during sports activities. If you have firearms in your house, make sure you follow all gun safety procedures. Seek help if you have been physically or sexually abused. What's next? Visit your health care provider once a year for an annual wellness visit. Ask your health care provider how often you should have your eyes and teeth checked. Stay up to date on all vaccines. This information is not intended to replace advice given to you by your health care provider. Make sure you discuss any questions you have with your health care provider. Document Revised: 05/17/2021 Document Reviewed: 05/17/2021 Elsevier Patient Education  2024 [employer].

## 2024-06-11 LAB — HEPATITIS C ANTIBODY: Hepatitis C Ab: NONREACTIVE

## 2024-07-07 NOTE — Progress Notes (Unsigned)
    Jaime Hampton [facility] [address] [place] [address] Phone: [phone]   Assessment and Plan:     There are no diagnoses linked to this encounter.  ***   Pertinent previous records reviewed include ***    Follow Up: ***     Subjective:   I, Jaime Hampton, am serving as a Neurosurgeon for Doctor Morene Mace  Chief Complaint: left hip and right shoulder pain  HPI:   07/08/2024 Patient is a 43 year old female with left hip and right shoulder pain. Patient states  Relevant Historical Information: ***  Additional pertinent review of systems negative.   Current Outpatient Medications:    diclofenac  (VOLTAREN ) 75 MG EC tablet, Take 1 tablet (75 mg total) by mouth 2 (two) times daily. (Patient not taking: Reported on 06/10/2024), Disp: 30 tablet, Rfl: 0   fluticasone (FLONASE) 50 MCG/ACT nasal spray, Place 1 spray into both nostrils daily as needed for allergies.  (Patient not taking: Reported on 06/10/2024), Disp: , Rfl:    Probiotic Product (PROBIOTIC PO), Take by mouth., Disp: , Rfl:    Objective:     There were no vitals filed for this visit.    There is no height or weight on file to calculate BMI.    Physical Exam:    ***   Electronically signed by:  Odis Mace [facility] 11:41 AM 07/07/24

## 2024-07-08 ENCOUNTER — Ambulatory Visit (INDEPENDENT_AMBULATORY_CARE_PROVIDER_SITE_OTHER)

## 2024-07-08 ENCOUNTER — Ambulatory Visit (INDEPENDENT_AMBULATORY_CARE_PROVIDER_SITE_OTHER): Admitting: Sports Medicine

## 2024-07-08 VITALS — BP 108/60 | HR 68 | Ht 65.0 in | Wt 136.4 lb

## 2024-07-08 DIAGNOSIS — M25561 Pain in right knee: Secondary | ICD-10-CM | POA: Diagnosis not present

## 2024-07-08 DIAGNOSIS — M79672 Pain in left foot: Secondary | ICD-10-CM

## 2024-07-08 DIAGNOSIS — M25552 Pain in left hip: Secondary | ICD-10-CM

## 2024-07-08 DIAGNOSIS — M25511 Pain in right shoulder: Secondary | ICD-10-CM | POA: Diagnosis not present

## 2024-07-08 DIAGNOSIS — M2550 Pain in unspecified joint: Secondary | ICD-10-CM

## 2024-07-08 DIAGNOSIS — G8929 Other chronic pain: Secondary | ICD-10-CM | POA: Diagnosis not present

## 2024-07-08 LAB — SEDIMENTATION RATE: Sed Rate: 5 mm/h (ref 0–20)

## 2024-07-08 LAB — VITAMIN D 25 HYDROXY (VIT D DEFICIENCY, FRACTURES): VITD: 53.32 ng/mL (ref 30.00–100.00)

## 2024-07-08 LAB — C-REACTIVE PROTEIN: CRP: <1 mg/dL (ref 0.5–20.0)

## 2024-07-08 LAB — FERRITIN: Ferritin: 38 ng/mL (ref 10.0–291.0)

## 2024-07-08 LAB — URIC ACID: Uric Acid, Serum: 4.1 mg/dL (ref 2.4–7.0)

## 2024-07-08 MED ORDER — MELOXICAM 15 MG PO TABS: 15.0000 mg | ORAL_TABLET | Freq: Every day | ORAL | 0 refills | Status: DC

## 2024-07-08 NOTE — Patient Instructions (Addendum)
-   Start meloxicam  15 mg daily x2 weeks.  If still having pain after 2 weeks, complete 3rd-week of NSAID. May use remaining NSAID as needed once daily for pain control.  Do not to use additional over-the-counter NSAIDs (ibuprofen , naproxen, Advil , Aleve, etc.) while taking prescription NSAIDs.  May use Tylenol  [phone] mg 2 to 3 times a day for breakthrough pain.  Hep shoulder and hip. Xray on the way out: right shoulder, right knee, left foot, left hip Labs on the way out. Follow up in 4 weeks.

## 2024-07-10 LAB — ANA+ENA+DNA/DS+SCL 70+SJOSSA/B
ANA Titer 1: NEGATIVE
ENA RNP Ab: 0.2 AI (ref 0.0–0.9)
ENA SM Ab Ser-aCnc: <0.2 AI (ref 0.0–0.9)
ENA SSA (RO) Ab: 0.2 AI (ref 0.0–0.9)
ENA SSB (LA) Ab: <0.2 AI (ref 0.0–0.9)
Scleroderma (Scl-70) (ENA) Antibody, IgG: <0.2 AI (ref 0.0–0.9)
dsDNA Ab: <1 [IU]/mL (ref 0–9)

## 2024-07-11 LAB — CYCLIC CITRUL PEPTIDE ANTIBODY, IGG: Cyclic Citrullin Peptide Ab: <16 U

## 2024-07-11 LAB — RHEUMATOID FACTOR: Rheumatoid fact SerPl-aCnc: <10 [IU]/mL (ref <14)

## 2024-07-12 ENCOUNTER — Ambulatory Visit: Payer: Self-pay | Admitting: Sports Medicine

## 2024-08-05 ENCOUNTER — Ambulatory Visit: Payer: Self-pay

## 2024-08-05 NOTE — Telephone Encounter (Signed)
 Noted

## 2024-08-05 NOTE — Telephone Encounter (Signed)
 Duplicate encounter, pt already triaged

## 2024-08-05 NOTE — Telephone Encounter (Signed)
  FYI Only or Action Required?: FYI only for provider.  Patient was last seen in primary care on 06/10/2024 by Jodie Lavern CROME, MD.  Called Nurse Triage reporting Facial Pain.  Symptoms began about a month ago.  Interventions attempted: OTC medications: Netty pot, advil .  Symptoms are: unchanged.  Triage Disposition: See PCP When Office is Open (Within 3 Days)  Patient/caregiver understands and will follow disposition?: Yes            Message from Robinson D sent at 08/05/2024 10:17 AM EDT  Sinus pressure, ears feel funny Irritated throat.   Reason for Disposition  [1] Sinus congestion (pressure, fullness) AND [2] present > 10 days  Answer Assessment - Initial Assessment Questions Stated she has been sick for a little over a month. Cough stopped a week ago, post nasal drip still there. Blowing light yellow drainage from nose. Not given any medication, nothing OTC, tried to kick it herself. Netty pot here and there, some advil  from time to time.   1. LOCATION: Where does it hurt?      Mostly on left, but also the right.  2. ONSET: When did the sinus pain start?  (e.g., hours, days)      Several weeks  3. SEVERITY: How bad is the pain?   (Scale 0-10; or none, mild, moderate or severe)     Irritated, more pressure on the left   4. RECURRENT SYMPTOM: Have you ever had sinus problems before? If Yes, ask: When was the last time? and What happened that time?      Yes, been sick entire week  5. NASAL CONGESTION: Is the nose blocked? If Yes, ask: Can you open it or must you breathe through your mouth?     Able to breath through nose   6. NASAL DISCHARGE: Do you have discharge from your nose? If so ask, What color?     Post nasal drip   7. FEVER: Do you have a fever? If Yes, ask: What is it, how was it measured, and when did it start?      No   8. OTHER SYMPTOMS: Do you have any other symptoms? (e.g., sore throat, cough, earache, difficulty  breathing)     Sore throat, both ears bothering.  Protocols used: Sinus Pain or Congestion-A-AH

## 2024-08-06 ENCOUNTER — Telehealth: Payer: Self-pay | Admitting: Sports Medicine

## 2024-08-06 NOTE — Telephone Encounter (Signed)
 Spoke with pt, agreeable to restrictions of telemedicine. Appt changed.

## 2024-08-06 NOTE — Telephone Encounter (Signed)
 Patient would like to know if she could make her follow up appointment (tomorrow) an virtual visit?  Please advise.

## 2024-08-06 NOTE — Progress Notes (Unsigned)
    Ben Jackson D.[facility] [address] [place] [address] Phone: [phone]   Assessment and Plan:     ***    Pertinent previous records reviewed include ***   Follow Up: ***     Subjective:   I, Jaime Hampton, am serving as a Neurosurgeon for Doctor Morene Mace  Chief Complaint: left hip and right shoulder pain   HPI:    07/08/2024 Patient is a 43 year old female with left hip and right shoulder pain. Patient states right shoulder has been a couple of year. Left side hip to toe. Bunion in left toe forming, when she stretches it out she feels some type of relief in leg. Right knee right in the knee cap. When she pushes on it she feels a bump there. It is tender to the touch. It has been there for a few months. Summer before last she was squatting to get something out of the pool and hurt her right knee. Right side of left toe is numb. Also has pain on the bottom of both feet. Comes and goes and wears inserts to help. Hips are worse with extending walking. Has pain with Yoga now but didn't used to be a problem.   Taking Medication for pain?no Numbness or Tingling? Left toe Does the pain Radiate? yes Altered gait or use? no ROM/ impairment of movement? Yes (hips)   08/07/2024 Patient states  Relevant Historical Information: None pertinent  Additional pertinent review of systems negative.   Current Outpatient Medications:    meloxicam  (MOBIC ) 15 MG tablet, Take 1 tablet (15 mg total) by mouth daily., Disp: 30 tablet, Rfl: 0   Probiotic Product (PROBIOTIC PO), Take by mouth., Disp: , Rfl:    Objective:     There were no vitals filed for this visit.    There is no height or weight on file to calculate BMI.    Physical Exam:    ***   Electronically signed by:  Odis Mace D.[facility] 7:41 AM 08/06/24

## 2024-08-07 ENCOUNTER — Telehealth: Admitting: Family Medicine

## 2024-08-07 ENCOUNTER — Telehealth (INDEPENDENT_AMBULATORY_CARE_PROVIDER_SITE_OTHER): Admitting: Sports Medicine

## 2024-08-07 DIAGNOSIS — G8929 Other chronic pain: Secondary | ICD-10-CM | POA: Diagnosis not present

## 2024-08-07 DIAGNOSIS — M25552 Pain in left hip: Secondary | ICD-10-CM

## 2024-08-07 DIAGNOSIS — M79672 Pain in left foot: Secondary | ICD-10-CM | POA: Diagnosis not present

## 2024-08-07 DIAGNOSIS — J0100 Acute maxillary sinusitis, unspecified: Secondary | ICD-10-CM | POA: Diagnosis not present

## 2024-08-07 DIAGNOSIS — M25561 Pain in right knee: Secondary | ICD-10-CM

## 2024-08-07 DIAGNOSIS — M25511 Pain in right shoulder: Secondary | ICD-10-CM

## 2024-08-07 DIAGNOSIS — L719 Rosacea, unspecified: Secondary | ICD-10-CM

## 2024-08-07 DIAGNOSIS — M2550 Pain in unspecified joint: Secondary | ICD-10-CM

## 2024-08-07 MED ORDER — CEFDINIR 300 MG PO CAPS
300.0000 mg | ORAL_CAPSULE | Freq: Two times a day (BID) | ORAL | 0 refills | Status: AC
Start: 2024-08-07 — End: ?

## 2024-08-07 MED ORDER — METRONIDAZOLE 0.75 % EX CREA
TOPICAL_CREAM | Freq: Two times a day (BID) | CUTANEOUS | 1 refills | Status: AC
Start: 2024-08-07 — End: ?

## 2024-08-07 NOTE — Patient Instructions (Signed)
 Meds sent to pharmacy.

## 2024-08-07 NOTE — Progress Notes (Signed)
 MyChart Video Visit Virtual Visit via Video Note   This visit type was conducted w/patient consent. This format is felt to be most appropriate for this patient at this time. Physical exam was limited by quality of the video and audio technology used for the visit. CMA was able to get the patient set up on a video visit.  Patient location: Home. Patient and provider in visit Provider location: Office  I discussed the limitations of evaluation and management by telemedicine and the availability of in person appointments. The patient expressed understanding and agreed to proceed.  Visit Date: 08/07/2024  Today's healthcare provider: Jenkins CHRISTELLA Carrel, MD     Subjective:    Patient ID: Jaime Hampton, female    DOB: [birth year], 43 y.o.   MRN: [id number]  Chief Complaint  Patient presents with   Sinus Problem    Sinus pressure, sinus drainage Sx started 5 weeks ago   Sore Throat    HPI Discussed the use of AI scribe software for clinical note transcription with the patient, who gave verbal consent to proceed.  History of Present Illness Jaime Hampton is a 43 year old female who presents with persistent sinus symptoms.  She has been experiencing sinus symptoms for approximately five weeks. Initially, she had sinus congestion and a severe cough, which persisted for four weeks before resolving. However, the sinus symptoms have lingered and worsened over the past week, with burning and discomfort on both sides, primarily pressure on the left side. She also reports postnasal drip and a sore throat, mainly on the left side. No fever is present, but she does experience headaches.  She has not taken any medications for this illness and has not checked for COVID-19 during this period. She regularly takes probiotics and daily vitamins and reports being generally healthy otherwise.  She has a history of rosacea and has run out of metronidazole  cream, which she used once daily as needed. She has been  using a steroid cream occasionally but feels she has been using it too often recently. She has not reestablished care with a dermatologist since retiring.     Past Medical History:  Diagnosis Date   Abnormal Pap smear    Allergy    Anxiety    Oral contraceptive use 05/27/2018   Rosacea 05/27/2018    Past Surgical History:  Procedure Laterality Date   DILATION AND CURETTAGE OF UTERUS      Outpatient Medications Prior to Visit  Medication Sig Dispense Refill   Probiotic Product (PROBIOTIC PO) Take by mouth.     meloxicam  (MOBIC ) 15 MG tablet Take 1 tablet (15 mg total) by mouth daily. 30 tablet 0   No facility-administered medications prior to visit.    No Known Allergies      Objective:     Physical Exam  Vitals and nursing note reviewed.  Constitutional:      General:  is not in acute distress.    Appearance: Normal appearance.  HENT:     [place]: Normocephalic.  nasally Pulmonary:     [place]: No respiratory distress.  Skin:    General: Skin is dry.     Coloration: Skin is not pale.  Neurological:     Mental Status: Pt is alert and oriented to person, place, and time.  Psychiatric:        Mood and Affect: Mood normal.   LMP 06/17/2024   Wt Readings from Last 3 Encounters:  07/08/24 136 lb 6.4 oz (61.9 kg)  06/10/24 134 lb (60.8 kg)  03/11/24 132 lb 6.4 oz (60.1 kg)       Assessment & Plan:   Problem List Items Addressed This Visit     Rosacea   Other Visit Diagnoses       Subacute maxillary sinusitis    -  Primary   Relevant Medications   cefdinir  (OMNICEF ) 300 MG capsule     Assessment and Plan Assessment & Plan Acute left maxillary sinusitis   Sinusitis symptoms have persisted for five weeks, initially improving but recently worsening, with left-sided sinus pressure, burning sensation, postnasal drip, and a sore throat on the left side. No fever is reported. The differential includes a viral infection with secondary bacterial sinusitis.  Antibiotics are initiated due to the prolonged duration of symptoms and potential bacterial involvement. Omnicef  is chosen over amoxicillin  due to concerns about efficacy and potential side effects of Augmentin , such as diarrhea. Prescribe Omnicef  for 7 days, twice daily. Advise her to contact if symptoms worsen or do not improve after 7 days of antibiotics. Discuss the use of a neti pot for sinus relief, noting some patients find it beneficial.  Rosacea   Rosacea is managed with metronidazole  cream. She has been using a steroid cream intermittently due to running out of metronidazole  but prefers metronidazole  cream for management. Prescribe metronidazole  cream for once daily use.    Meds ordered this encounter  Medications   cefdinir  (OMNICEF ) 300 MG capsule    Sig: Take 1 capsule (300 mg total) by mouth 2 (two) times daily.    Dispense:  14 capsule    Refill:  0   metroNIDAZOLE  (METROCREAM ) 0.75 % cream    Sig: Apply topically 2 (two) times daily.    Dispense:  45 g    Refill:  1    I discussed the assessment and treatment plan with the patient. The patient was provided an opportunity to ask questions and all were answered. The patient agreed with the plan and demonstrated an understanding of the instructions.   The patient was advised to call back or seek an in-person evaluation if the symptoms worsen or if the condition fails to improve as anticipated.  Return if symptoms worsen or fail to improve.  Jenkins CHRISTELLA Carrel, MD [facility] at [facility] [phone] (phone) [phone] (fax)  [facility]

## 2024-09-18 DIAGNOSIS — H524 Presbyopia: Secondary | ICD-10-CM | POA: Diagnosis not present

## 2024-09-18 DIAGNOSIS — H5213 Myopia, bilateral: Secondary | ICD-10-CM | POA: Diagnosis not present

## 2024-09-18 DIAGNOSIS — H52223 Regular astigmatism, bilateral: Secondary | ICD-10-CM | POA: Diagnosis not present

## 2024-09-30 DIAGNOSIS — Z1212 Encounter for screening for malignant neoplasm of rectum: Secondary | ICD-10-CM | POA: Diagnosis not present

## 2024-09-30 DIAGNOSIS — Z1211 Encounter for screening for malignant neoplasm of colon: Secondary | ICD-10-CM | POA: Diagnosis not present

## 2024-10-10 LAB — COLOGUARD: COLOGUARD: NEGATIVE

## 2024-10-13 NOTE — Progress Notes (Signed)
Negative cologuard. Mc note sent

## 2024-11-11 DIAGNOSIS — Z1331 Encounter for screening for depression: Secondary | ICD-10-CM | POA: Diagnosis not present

## 2024-11-11 DIAGNOSIS — N951 Menopausal and female climacteric states: Secondary | ICD-10-CM | POA: Diagnosis not present

## 2025-06-16 ENCOUNTER — Encounter: Admitting: Family Medicine
